# Patient Record
Sex: Male | Born: 1985 | Race: White | Hispanic: No | Marital: Single | Smoking: Current every day smoker
Health system: Southern US, Community
[De-identification: ages and names within clinical notes are randomized; demographics above are authoritative.]

## PROBLEM LIST (undated history)

## (undated) DIAGNOSIS — S069X9A Unspecified intracranial injury with loss of consciousness of unspecified duration, initial encounter: Secondary | ICD-10-CM

## (undated) DIAGNOSIS — S069XAA Unspecified intracranial injury with loss of consciousness status unknown, initial encounter: Secondary | ICD-10-CM

## (undated) DIAGNOSIS — B1920 Unspecified viral hepatitis C without hepatic coma: Secondary | ICD-10-CM

## (undated) HISTORY — PX: TONSILLECTOMY: SUR1361

## (undated) HISTORY — PX: FEMUR FRACTURE SURGERY: SHX633

## (undated) HISTORY — PX: LEG SURGERY: SHX1003

---

## 2004-07-23 ENCOUNTER — Other Ambulatory Visit: Payer: Self-pay

## 2004-07-23 ENCOUNTER — Emergency Department: Payer: Self-pay | Admitting: Internal Medicine

## 2004-12-25 ENCOUNTER — Inpatient Hospital Stay: Payer: Self-pay | Admitting: Psychiatry

## 2006-12-14 ENCOUNTER — Emergency Department: Payer: Self-pay | Admitting: Emergency Medicine

## 2009-02-08 ENCOUNTER — Emergency Department: Payer: Self-pay | Admitting: Emergency Medicine

## 2010-11-05 ENCOUNTER — Inpatient Hospital Stay: Payer: Self-pay | Admitting: Psychiatry

## 2011-11-06 ENCOUNTER — Emergency Department: Payer: Self-pay | Admitting: Emergency Medicine

## 2012-04-07 ENCOUNTER — Emergency Department: Payer: Self-pay | Admitting: Emergency Medicine

## 2015-08-24 ENCOUNTER — Encounter: Payer: Self-pay | Admitting: Emergency Medicine

## 2015-08-24 ENCOUNTER — Emergency Department
Admission: EM | Admit: 2015-08-24 | Discharge: 2015-08-24 | Disposition: A | Discharge status: Home / Self Care | Payer: No Typology Code available for payment source | Attending: Emergency Medicine | Admitting: Emergency Medicine

## 2015-08-24 DIAGNOSIS — F17210 Nicotine dependence, cigarettes, uncomplicated: Secondary | ICD-10-CM | POA: Diagnosis not present

## 2015-08-24 DIAGNOSIS — S3992XA Unspecified injury of lower back, initial encounter: Secondary | ICD-10-CM | POA: Diagnosis not present

## 2015-08-24 DIAGNOSIS — S4992XA Unspecified injury of left shoulder and upper arm, initial encounter: Secondary | ICD-10-CM | POA: Insufficient documentation

## 2015-08-24 DIAGNOSIS — S79911A Unspecified injury of right hip, initial encounter: Secondary | ICD-10-CM | POA: Insufficient documentation

## 2015-08-24 DIAGNOSIS — Y92009 Unspecified place in unspecified non-institutional (private) residence as the place of occurrence of the external cause: Secondary | ICD-10-CM | POA: Diagnosis not present

## 2015-08-24 DIAGNOSIS — Y998 Other external cause status: Secondary | ICD-10-CM | POA: Diagnosis not present

## 2015-08-24 DIAGNOSIS — S60512A Abrasion of left hand, initial encounter: Secondary | ICD-10-CM | POA: Diagnosis not present

## 2015-08-24 DIAGNOSIS — Y9389 Activity, other specified: Secondary | ICD-10-CM | POA: Diagnosis not present

## 2015-08-24 DIAGNOSIS — S6992XA Unspecified injury of left wrist, hand and finger(s), initial encounter: Secondary | ICD-10-CM | POA: Diagnosis present
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
## 2015-08-25 ENCOUNTER — Telehealth: Payer: Self-pay | Admitting: Emergency Medicine

## 2015-08-25 NOTE — ED Notes (Signed)
Called patient due to lwot to inquire about condition and follow up plans. The phone will not accept calls.

## 2015-09-10 ENCOUNTER — Emergency Department
Admission: EM | Admit: 2015-09-10 | Discharge: 2015-09-11 | Disposition: A | Discharge status: Other Acute Inpatient Hospital | Payer: No Typology Code available for payment source | Attending: Emergency Medicine | Admitting: Emergency Medicine

## 2015-09-10 ENCOUNTER — Encounter: Payer: Self-pay | Admitting: Emergency Medicine

## 2015-09-10 DIAGNOSIS — F1010 Alcohol abuse, uncomplicated: Secondary | ICD-10-CM | POA: Insufficient documentation

## 2015-09-10 DIAGNOSIS — F339 Major depressive disorder, recurrent, unspecified: Secondary | ICD-10-CM

## 2015-09-10 DIAGNOSIS — F1910 Other psychoactive substance abuse, uncomplicated: Secondary | ICD-10-CM

## 2015-09-10 DIAGNOSIS — Z79899 Other long term (current) drug therapy: Secondary | ICD-10-CM | POA: Insufficient documentation

## 2015-09-10 DIAGNOSIS — R45851 Suicidal ideations: Secondary | ICD-10-CM | POA: Insufficient documentation

## 2015-09-10 DIAGNOSIS — F1410 Cocaine abuse, uncomplicated: Secondary | ICD-10-CM | POA: Insufficient documentation

## 2015-09-10 DIAGNOSIS — F1320 Sedative, hypnotic or anxiolytic dependence, uncomplicated: Secondary | ICD-10-CM

## 2015-09-10 DIAGNOSIS — F329 Major depressive disorder, single episode, unspecified: Secondary | ICD-10-CM | POA: Insufficient documentation

## 2015-09-10 DIAGNOSIS — F1420 Cocaine dependence, uncomplicated: Secondary | ICD-10-CM

## 2015-09-10 DIAGNOSIS — F17210 Nicotine dependence, cigarettes, uncomplicated: Secondary | ICD-10-CM | POA: Insufficient documentation

## 2015-09-10 DIAGNOSIS — F1520 Other stimulant dependence, uncomplicated: Secondary | ICD-10-CM

## 2015-09-10 HISTORY — DX: Unspecified intracranial injury with loss of consciousness of unspecified duration, initial encounter: S06.9X9A

## 2015-09-10 HISTORY — DX: Unspecified intracranial injury with loss of consciousness status unknown, initial encounter: S06.9XAA

## 2015-09-10 LAB — CBC
HEMATOCRIT: 40.2 % (ref 40.0–52.0)
Hemoglobin: 14.1 g/dL (ref 13.0–18.0)
MCH: 32 pg (ref 26.0–34.0)
MCHC: 35 g/dL (ref 32.0–36.0)
MCV: 91.5 fL (ref 80.0–100.0)
Platelets: 201 10*3/uL (ref 150–440)
RBC: 4.39 MIL/uL — AB (ref 4.40–5.90)
RDW: 13.5 % (ref 11.5–14.5)
WBC: 5.5 10*3/uL (ref 3.8–10.6)

## 2015-09-10 LAB — COMPREHENSIVE METABOLIC PANEL
ALBUMIN: 4.5 g/dL (ref 3.5–5.0)
ALT: 81 U/L — ABNORMAL HIGH (ref 17–63)
ANION GAP: 5 (ref 5–15)
AST: 67 U/L — ABNORMAL HIGH (ref 15–41)
Alkaline Phosphatase: 62 U/L (ref 38–126)
BILIRUBIN TOTAL: 0.6 mg/dL (ref 0.3–1.2)
BUN: 14 mg/dL (ref 6–20)
CHLORIDE: 102 mmol/L (ref 101–111)
CO2: 30 mmol/L (ref 22–32)
Calcium: 8.9 mg/dL (ref 8.9–10.3)
Creatinine, Ser: 1.09 mg/dL (ref 0.61–1.24)
GFR calc non Af Amer: >60 mL/min (ref >60)
Glucose, Bld: 95 mg/dL (ref 65–99)
Potassium: 3.4 mmol/L — ABNORMAL LOW (ref 3.5–5.1)
SODIUM: 137 mmol/L (ref 135–145)
Total Protein: 6.9 g/dL (ref 6.5–8.1)

## 2015-09-10 LAB — URINE DRUG SCREEN, QUALITATIVE (ARMC ONLY)
AMPHETAMINES, UR SCREEN: POSITIVE — AB
Barbiturates, Ur Screen: NOT DETECTED
Benzodiazepine, Ur Scrn: POSITIVE — AB
COCAINE METABOLITE, UR ~~LOC~~: POSITIVE — AB
Cannabinoid 50 Ng, Ur ~~LOC~~: NOT DETECTED
MDMA (ECSTASY) UR SCREEN: NOT DETECTED
Methadone Scn, Ur: NOT DETECTED
OPIATE, UR SCREEN: NOT DETECTED
PHENCYCLIDINE (PCP) UR S: NOT DETECTED
Tricyclic, Ur Screen: NOT DETECTED

## 2015-09-10 LAB — ETHANOL: Alcohol, Ethyl (B): 14 mg/dL — ABNORMAL HIGH (ref <5)

## 2015-09-10 NOTE — ED Notes (Signed)
Pt presents to ED with feelings of depression and wanting to hurt himself for the past several days. Denies plan or recent attempted suicide. Pt states he is not currently hearing voices. Using ETOH, cocaine, and suboxone daily and would like help stopping his substance use problem. Hx of depression.

## 2015-09-10 NOTE — ED Provider Notes (Signed)
[facility] Emergency Department Provider Note  ____________________________________________  Time seen: Approximately 10:21 PM  I have reviewed the triage vital signs and the nursing notes.   HISTORY  Chief Complaint Psychiatric Evaluation and Suicidal    HPI Joseph Hines is a 30 y.o. male history depression. Patient reports that about 2 hours prior to coming in he thought about taking his own life but took no action.  He reports he is depressed, using cocaine, alcohol, and Suboxone.  No fevers chills or recent illness. He can try to seek help through his church, but has not been unable to be placed in a program for substance abuse yet.  Denies any overdose or attempted ingestion or self-harm.  No fever or chills.  Reports a long history of depression and alcohol abuse.  Past Medical History  Diagnosis Date  . Depression   . Traumatic brain injury (HCC)     There are no active problems to display for this patient.   Past Surgical History  Procedure Laterality Date  . Femur fracture surgery      No current outpatient prescriptions on file.  Allergies Review of patient's allergies indicates no known allergies.  No family history on file.  Social History Social History  Substance Use Topics  . Smoking status: Current Every Day Smoker -- 1.00 packs/day    Types: Cigarettes  . Smokeless tobacco: Never Used  . Alcohol Use: 14.4 oz/week    24 Cans of beer per week     Comment: 5 days a week    Review of Systems Constitutional: No fever/chills Eyes: No visual changes. ENT: No sore throat. Cardiovascular: Denies chest pain. Respiratory: Denies shortness of breath. Gastrointestinal: No abdominal pain.  No nausea, no vomiting.  No diarrhea.  No constipation. Genitourinary: Negative for dysuria. Musculoskeletal: Negative for back pain. Skin: Negative for rash. Neurological: Negative for headaches, focal weakness or  numbness.  10-point ROS otherwise negative.  ____________________________________________   PHYSICAL EXAM:  VITAL SIGNS: ED Triage Vitals  Enc Vitals Group     BP 09/10/15 2109 112/74 mmHg     Pulse Rate 09/10/15 2109 101     Resp 09/10/15 2109 20     Temp 09/10/15 2109 98.2 F (36.8 C)     Temp Source 09/10/15 2109 Oral     SpO2 09/10/15 2109 96 %     Weight 09/10/15 2109 190 lb (86.183 kg)     Height 09/10/15 2109 5\' 8"  (1.727 m)     Head Cir --      Peak Flow --      Pain Score --      Pain Loc --      Pain Edu? --      Excl. in GC? --    Constitutional: Alert and oriented. Well appearing and in no acute distress. Eyes: Conjunctivae are normal. PERRL. EOMI. Head: Atraumatic. Nose: No congestion/rhinnorhea. Mouth/Throat: Mucous membranes are moist.  Oropharynx non-erythematous. Neck: No stridor.   Cardiovascular: Normal rate, regular rhythm. Grossly normal heart sounds.  Good peripheral circulation. Respiratory: Normal respiratory effort.  No retractions. Lungs CTAB. Gastrointestinal: Soft and nontender. No distention. Musculoskeletal: No lower extremity tenderness nor edema.  No joint effusions. Neurologic:  Normal speech and language. No gross focal neurologic deficits are appreciated. No gait instability. Skin:  Skin is warm, dry and intact. No rash noted. Psychiatric: Mood and affect are Somewhat depressed, reports suicidal thoughts. ____________________________________________   LABS (all labs ordered are listed, but only abnormal  results are displayed)  Labs Reviewed  COMPREHENSIVE METABOLIC PANEL - Abnormal; Notable for the following:    Potassium 3.4 (*)    AST 67 (*)    ALT 81 (*)    All other components within normal limits  ETHANOL - Abnormal; Notable for the following:    Alcohol, Ethyl (B) 14 (*)    All other components within normal limits  CBC - Abnormal; Notable for the following:    RBC 4.39 (*)    All other components within normal limits   URINE DRUG SCREEN, QUALITATIVE ([facility] ONLY) - Abnormal; Notable for the following:    Amphetamines, Ur Screen POSITIVE (*)    Cocaine Metabolite,Ur [place] POSITIVE (*)    Benzodiazepine, Ur Scrn POSITIVE (*)    All other components within normal limits  ACETAMINOPHEN LEVEL  SALICYLATE LEVEL   ____________________________________________  EKG   ____________________________________________  RADIOLOGY   ____________________________________________   PROCEDURES  Procedure(s) performed: None  Critical Care performed: No  ____________________________________________   INITIAL IMPRESSION / ASSESSMENT AND PLAN / ED COURSE  Pertinent labs & imaging results that were available during my care of the patient were reviewed by me and considered in my medical decision making (see chart for details).  Patient reports suicidal ideation, also has polysubstance abuse including alcohol.As the patient's suicidal thoughts and reporting that 2 hours ago and was thought about and was going to take his own life but did not, I have placed him under involuntary commitment and ordered psychiatric consultation. Because of his history of heavy alcohol abuse I have placed a month C while though at the present time no signs of acute withdrawal.  Labs overall reassuring, ongoing care assigned to Dr. Dolores Frame. She will follow-up on psychiatry recommendations as well as salicylate and Tylenol levels which have been added on. ____________________________________________   FINAL CLINICAL IMPRESSION(S) / ED DIAGNOSES  Final diagnoses:  Recurrent major depressive disorder, remission status unspecified (HCC)  Polysubstance abuse      Sharyn Creamer, MD 09/11/15 [phone]

## 2015-09-10 NOTE — ED Notes (Addendum)
Patient contracts for safety with this RN.  Patient reports increased stress of not being able to find employment and long substance abuse history.  Patient reports suicidal thoughts but no specific plan, "I don't really want to do anything, but I have these thoughts."  Patient denies any outstanding warrants, but reports does have pending court date on May 9th.

## 2015-09-11 ENCOUNTER — Inpatient Hospital Stay
Admission: RE | Admit: 2015-09-11 | Discharge: 2015-09-14 | DRG: 897 | Disposition: A | Discharge status: Home / Self Care | Payer: No Typology Code available for payment source | Source: Intra-hospital | Attending: Psychiatry | Admitting: Psychiatry

## 2015-09-11 DIAGNOSIS — Z9889 Other specified postprocedural states: Secondary | ICD-10-CM

## 2015-09-11 DIAGNOSIS — F1520 Other stimulant dependence, uncomplicated: Secondary | ICD-10-CM | POA: Diagnosis present

## 2015-09-11 DIAGNOSIS — F17210 Nicotine dependence, cigarettes, uncomplicated: Secondary | ICD-10-CM | POA: Diagnosis present

## 2015-09-11 DIAGNOSIS — F1994 Other psychoactive substance use, unspecified with psychoactive substance-induced mood disorder: Secondary | ICD-10-CM

## 2015-09-11 DIAGNOSIS — F17200 Nicotine dependence, unspecified, uncomplicated: Secondary | ICD-10-CM

## 2015-09-11 DIAGNOSIS — F1123 Opioid dependence with withdrawal: Secondary | ICD-10-CM | POA: Diagnosis present

## 2015-09-11 DIAGNOSIS — F19239 Other psychoactive substance dependence with withdrawal, unspecified: Secondary | ICD-10-CM

## 2015-09-11 DIAGNOSIS — F1390 Sedative, hypnotic, or anxiolytic use, unspecified, uncomplicated: Secondary | ICD-10-CM

## 2015-09-11 DIAGNOSIS — G4700 Insomnia, unspecified: Secondary | ICD-10-CM | POA: Diagnosis present

## 2015-09-11 DIAGNOSIS — F1420 Cocaine dependence, uncomplicated: Secondary | ICD-10-CM

## 2015-09-11 DIAGNOSIS — F1120 Opioid dependence, uncomplicated: Secondary | ICD-10-CM

## 2015-09-11 DIAGNOSIS — F1020 Alcohol dependence, uncomplicated: Secondary | ICD-10-CM

## 2015-09-11 DIAGNOSIS — F1320 Sedative, hypnotic or anxiolytic dependence, uncomplicated: Secondary | ICD-10-CM

## 2015-09-11 DIAGNOSIS — F3289 Other specified depressive episodes: Secondary | ICD-10-CM

## 2015-09-11 DIAGNOSIS — F1924 Other psychoactive substance dependence with psychoactive substance-induced mood disorder: Secondary | ICD-10-CM | POA: Diagnosis present

## 2015-09-11 DIAGNOSIS — F329 Major depressive disorder, single episode, unspecified: Secondary | ICD-10-CM | POA: Diagnosis present

## 2015-09-11 LAB — ACETAMINOPHEN LEVEL

## 2015-09-11 LAB — SALICYLATE LEVEL

## 2015-09-11 MED ORDER — IBUPROFEN 800 MG PO TABS
ORAL_TABLET | ORAL | Status: AC
  Filled 2015-09-11: qty 1

## 2015-09-11 MED ORDER — IBUPROFEN 800 MG PO TABS
800.0000 mg | ORAL_TABLET | Freq: Once | ORAL | Status: AC
  Administered 2015-09-11: 800 mg via ORAL

## 2015-09-11 MED ORDER — CITALOPRAM HYDROBROMIDE 20 MG PO TABS
20.0000 mg | ORAL_TABLET | Freq: Every day | ORAL | Status: DC
Start: 2015-09-11 — End: 2015-09-11
  Administered 2015-09-11: 20 mg via ORAL
  Filled 2015-09-11: qty 1

## 2015-09-11 MED ORDER — HALOPERIDOL 0.5 MG PO TABS
1.0000 mg | ORAL_TABLET | Freq: Two times a day (BID) | ORAL | Status: DC
  Administered 2015-09-11 (×2): 1 mg via ORAL
  Filled 2015-09-11 (×2): qty 2

## 2015-09-11 NOTE — ED Notes (Signed)
Patient asleep in room. No noted distress or abnormal behavior. Will continue 15 minute checks and observation by security cameras for safety. 

## 2015-09-11 NOTE — ED Notes (Signed)
Patient asleep in room. No noted distress or abnormal behavior. Will continue 15 minute checks and observation by security cameras for safety. 
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
## 2015-09-11 NOTE — Consult Note (Signed)
[facility] Face-to-Face Psychiatry Consult   Reason for Consult:  SI with substance abuse  Referring Physician:  Delman Kitten, MD  Patient Identification: Joseph Hines MRN:  [id number]   Principal Diagnosis: Substance or medication-induced depressive disorder with onset during withdrawal [facility]) Diagnosis:   Patient Active Problem List   Diagnosis Date Noted  . Substance or medication-induced depressive disorder with onset during withdrawal ([place]) [F19.94] 09/11/2015  . Alcohol use disorder, severe, in controlled environment ([place]) [F10.20] 09/11/2015  . Cocaine use disorder, severe, dependence ([place]) [F14.20] 09/11/2015  . Severe benzodiazepine use disorder [F13.90] 09/11/2015  . Amphetamine use disorder, severe, dependence ([place]) [F15.20] 09/11/2015    Total Time spent with patient: 30 minutes  Subjective:   Joseph Hines is a 30 y.o. male patient with a h/o depression and substance abuse who presented to [facility] ED on 09-11-15 due to 2 hours of SI prior to ED arrival. The pt denied to the ED provider that he tried to take his life.  He reported to ED nursing that he is under increased stress due to not being able to find employment. He shared with ED nursing, "I don't really want to do anything, but I have these thoughts."  The pt spoke with TTS early on the morning of 09-11-15 and notes he is depressed and wants detox. He uses heroin (IV/snorting), cocaine (smoking/snorting), alcohol and Suboxone (he places the strips under his tongue).  Pt reported a past overdose on heroin. He also reported past detox treatment which was helpful while enrolled in a substance abuse treatment program.  Shortly after discharge from substance abuse treatment, the pt relapsed.    Current triggers of depression include no supports, unemployment, past/current legal charges & conflict with current relationship with his girlfriend. Pt's girlfriend was unfaithful and now the pt has nowhere to live as he was living with his  girlfriend prior to admission.  Due to releases from prison in 2007 and releases from county jail last year, he finds it difficult to adjust to community living.  He has an upcoming court date on 10-04-2015 for hit and run/property damage.    Today on interview, the pt is resting comfortably in bed. He is easily awakened. Pt reviewed his reasons for admission sharing, "I've had enough of feeling the way I've been feeling." He discussed being depressed for years but "last night was enough."    His longest sobriety was when he was in prison for 1 year. He shared the aforementioned stressors which are worsening his depression. He feels his depression improves when he self-medicates with drugs.   He endorses feeling paranoid and judged by others. "I've made some mistakes in my life and I'm always looking around me." He endorses feelings of hopelessness, helplessness and worthlessness. He endorses tearfulness. He denies having supports and notes his parents don't' want to speak with him. He is not currently on medications and does not believe he has been on psychotropic medications in the past unless when he was in prison. Pt denies SI and HI. He feels at times he will experience AH and VH but he is not certain. He endorses feeling irritable, agitated. He also endorses withdrawals symptoms including feeling clammy and cold.   He notes he drinks one case of beer daily. He denies SI, HI and AVH. Pt is asking for help to treat depression as well as substance abuse.   Past Psychiatric History:  Dx: none Denies having a psychiatrist or therapist.  Denies past psychiatric hospitalizations.  Denies  previous psychotropic medication treatment unless he took a medication when he was in prison.   Risk to Self: Suicidal Ideation: Yes-Currently Present Suicidal Intent: Yes-Currently Present Is patient at risk for suicide?: Yes Suicidal Plan?: No-Not Currently/Within Last 6 Months ("Haven't thought that  far") Access to Means: No What has been your use of drugs/alcohol within the last 12 months?: Heroin, cocaine, alcohol, suboxene How many times?: 0 Other Self Harm Risks: None Reported Triggers for Past Attempts: None known Intentional Self Injurious Behavior: None Risk to Others: Homicidal Ideation: No Thoughts of Harm to Others: No Current Homicidal Intent: No Current Homicidal Plan: No Access to Homicidal Means: No Identified Victim: N/A History of harm to others?: Yes (Past legal involvement) Assessment of Violence: None Noted Violent Behavior Description: N/A Does patient have access to weapons?: No Criminal Charges Pending?: Yes Describe Pending Criminal Charges: Hit & Run/Property Damage Does patient have a court date: Yes Court Date: 10/01/15 Prior Inpatient Therapy: Prior Inpatient Therapy: Yes Prior Therapy Dates: [facility] Prior Therapy Facilty/Provider(s): [facility] Reason for Treatment: Detox Prior Outpatient Therapy: Prior Outpatient Therapy: Yes Prior Therapy Dates: Adolescent Age Prior Therapy Facilty/Provider(s): [facility] Reason for Treatment: "My parents tried to understand" Does patient have an ACCT team?: No Does patient have Intensive In-House Services?  : No Does patient have [facility] services? : No Does patient have P4CC services?: No  Past Medical History:  Past Medical History  Diagnosis Date  . Depression   . Traumatic brain injury [facility])     Past Surgical History  Procedure Laterality Date  . Femur fracture surgery     Family History: No family history on file.   Family Psychiatric History: Unknown  Social History:  History  Alcohol Use  . 14.4 oz/week  . 24 Cans of beer per week    Comment: 5 days a week     History  Drug Use  . Yes  . Special: Cocaine    Comment: suboxene, adderall, klonopin    Social History   Social History  . Marital Status: Single    Spouse Name: N/A  . Number of Children: N/A  . Years of Education: N/A   Social  History Main Topics  . Smoking status: Current Every Day Smoker -- 1.00 packs/day    Types: Cigarettes  . Smokeless tobacco: Never Used  . Alcohol Use: 14.4 oz/week    24 Cans of beer per week     Comment: 5 days a week  . Drug Use: Yes    Special: Cocaine     Comment: suboxene, adderall, klonopin  . Sexual Activity: Yes   Other Topics Concern  . None   Social History Narrative   Additional Social History:    Allergies:  No Known Allergies  Labs:  Results for orders placed or performed during the hospital encounter of 09/10/15 (from the past 48 hour(s))  Comprehensive metabolic panel     Status: Abnormal   Collection Time: 09/10/15  9:27 PM  Result Value Ref Range   Sodium 137 135 - 145 mmol/L   Potassium 3.4 (L) 3.5 - 5.1 mmol/L   Chloride 102 101 - 111 mmol/L   CO2 30 22 - 32 mmol/L   Glucose, Bld 95 65 - 99 mg/dL   BUN 14 6 - 20 mg/dL   Creatinine, Ser 1.09 0.61 - 1.24 mg/dL   Calcium 8.9 8.9 - 10.3 mg/dL   Total Protein 6.9 6.5 - 8.1 g/dL   Albumin 4.5 3.5 - 5.0 g/dL  AST 67 (H) 15 - 41 U/L   ALT 81 (H) 17 - 63 U/L   Alkaline Phosphatase 62 38 - 126 U/L   Total Bilirubin 0.6 0.3 - 1.2 mg/dL   GFR calc non Af Amer >60 >60 mL/min   GFR calc Af Amer >60 >60 mL/min    Comment: (NOTE) The eGFR has been calculated using the CKD EPI equation. This calculation has not been validated in all clinical situations. eGFR's persistently <60 mL/min signify possible Chronic Kidney Disease.    Anion gap 5 5 - 15  Ethanol (ETOH)     Status: Abnormal   Collection Time: 09/10/15  9:27 PM  Result Value Ref Range   Alcohol, Ethyl (B) 14 (H) <5 mg/dL    Comment:        LOWEST DETECTABLE LIMIT FOR SERUM ALCOHOL IS 5 mg/dL FOR MEDICAL PURPOSES ONLY   CBC     Status: Abnormal   Collection Time: 09/10/15  9:27 PM  Result Value Ref Range   WBC 5.5 3.8 - 10.6 K/uL   RBC 4.39 (L) 4.40 - 5.90 MIL/uL   Hemoglobin 14.1 13.0 - 18.0 g/dL   HCT 40.2 40.0 - 52.0 %   MCV 91.5 80.0 -  100.0 fL   MCH 32.0 26.0 - 34.0 pg   MCHC 35.0 32.0 - 36.0 g/dL   RDW 13.5 11.5 - 14.5 %   Platelets 201 150 - 440 K/uL  Acetaminophen level     Status: Abnormal   Collection Time: 09/10/15  9:27 PM  Result Value Ref Range   Acetaminophen (Tylenol), Serum <10 (L) 10 - 30 ug/mL    Comment:        THERAPEUTIC CONCENTRATIONS VARY SIGNIFICANTLY. A RANGE OF 10-30 ug/mL MAY BE AN EFFECTIVE CONCENTRATION FOR MANY PATIENTS. HOWEVER, SOME ARE BEST TREATED AT CONCENTRATIONS OUTSIDE THIS RANGE. ACETAMINOPHEN CONCENTRATIONS >150 ug/mL AT 4 HOURS AFTER INGESTION AND >50 ug/mL AT 12 HOURS AFTER INGESTION ARE OFTEN ASSOCIATED WITH TOXIC REACTIONS.   Salicylate level     Status: None   Collection Time: 09/10/15  9:27 PM  Result Value Ref Range   Salicylate Lvl <6.7 2.8 - 30.0 mg/dL  Urine Drug Screen, Qualitative ([facility] only)     Status: Abnormal   Collection Time: 09/10/15  9:42 PM  Result Value Ref Range   Tricyclic, Ur Screen NONE DETECTED NONE DETECTED   Amphetamines, Ur Screen POSITIVE (A) NONE DETECTED   MDMA (Ecstasy)Ur Screen NONE DETECTED NONE DETECTED   Cocaine Metabolite,Ur [place] POSITIVE (A) NONE DETECTED   Opiate, Ur Screen NONE DETECTED NONE DETECTED   Phencyclidine (PCP) Ur S NONE DETECTED NONE DETECTED   Cannabinoid 50 Ng, Ur [place] NONE DETECTED NONE DETECTED   Barbiturates, Ur Screen NONE DETECTED NONE DETECTED   Benzodiazepine, Ur Scrn POSITIVE (A) NONE DETECTED   Methadone Scn, Ur NONE DETECTED NONE DETECTED    Comment: (NOTE) 341  Tricyclics, urine               Cutoff 1000 ng/mL 200  Amphetamines, urine             Cutoff 1000 ng/mL 300  MDMA (Ecstasy), urine           Cutoff 500 ng/mL 400  Cocaine Metabolite, urine       Cutoff 300 ng/mL 500  Opiate, urine                   Cutoff 300 ng/mL 600  Phencyclidine (PCP), urine  Cutoff 25 ng/mL 700  Cannabinoid, urine              Cutoff 50 ng/mL 800  Barbiturates, urine             Cutoff 200 ng/mL 900  Benzodiazepine,  urine           Cutoff 200 ng/mL 1000 Methadone, urine                Cutoff 300 ng/mL 1100 1200 The urine drug screen provides only a preliminary, unconfirmed 1300 analytical test result and should not be used for non-medical 1400 purposes. Clinical consideration and professional judgment should 1500 be applied to any positive drug screen result due to possible 1600 interfering substances. A more specific alternate chemical method 1700 must be used in order to obtain a confirmed analytical result.  1800 Gas chromato graphy / mass spectrometry (GC/MS) is the preferred 1900 confirmatory method.     No current facility-administered medications for this encounter.   No current outpatient prescriptions on file.    Musculoskeletal: Strength & Muscle Tone: within normal limits Gait & Station: normal Patient leans: N/A  Psychiatric Specialty Exam: Review of Systems  Constitutional: Negative.   HENT: Negative.   Eyes: Negative.   Respiratory: Negative.   Cardiovascular: Negative.   Gastrointestinal: Negative.   Genitourinary: Negative.   Musculoskeletal: Negative.   Skin: Negative.   Neurological: Negative.   Endo/Heme/Allergies: Negative.   Psychiatric/Behavioral: Positive for depression, hallucinations and substance abuse. Negative for suicidal ideas and memory loss. The patient is nervous/anxious and has insomnia.        Feels cold and clammy due to withdrawals.     Blood pressure 105/66, pulse 77, temperature 98 F (36.7 C), temperature source Oral, resp. rate 16, height 5' 8"  (1.727 m), weight 86.183 kg (190 lb), SpO2 95 %.Body mass index is 28.9 kg/(m^2).  General Appearance: Casual, Fairly Groomed and tattooed  Eye Contact::  Poor  Speech:  Clear and Coherent and Normal Rate  Volume:  Normal  Mood:  Anxious, Dysphoric, Hopeless, Irritable and Worthless  Affect:  Restricted  Thought Process:  Coherent, Goal Directed, Intact, Linear and Logical  Orientation:  Full  (Time, Place, and Person)  Thought Content:  Hallucinations: Auditory Command:  No Visual and Paranoid Ideation  Suicidal Thoughts:  No  Homicidal Thoughts:  No  Memory:  Immediate;   Good Recent;   Good Remote;   Good  Judgement:  Poor  Insight:  Fair  Psychomotor Activity:  Normal  Concentration:  Fair  Recall:  Good  Fund of Knowledge:Good  Language: Good  Akathisia:  Negative  Handed:  Right  AIMS (if indicated):     Assets:  Communication Skills Desire for Improvement Physical Health  ADL's:  Intact  Cognition: WNL  Sleep:      Treatment Plan Summary: Daily contact with patient to assess and evaluate symptoms and progress in treatment and Medication management   - Admit to inpatient psychiatry.   1. Substance induced depressive disorder: worsening  - Start citalopram 36m po QD.  - Start haloperidol 128mpo BID for paranoia and AVH.   2. Alcohol use disorder, severe:  - Continue CIWA protocol  3. Cocaine use disorder, severe:  - Symptomatic treatment as necessary  4. Benzodiazepine use disorder, severe  - Pt currently on CIWA  - - Symptomatic treatment as necessary  5. Amphetamine use disorder, severe:  - Symptomatic treatment as necessary  6. R/B/Se discussed including but not  limited to any and all black box warnings, akathisia, GI upset, mood derangements, EPS and TD.   7. Continue to monitor.   Disposition: Recommend psychiatric Inpatient admission when medically cleared.Opioid   Donita Brooks, MD 09/11/2015 9:54 AM

## 2015-09-11 NOTE — ED Notes (Signed)
Pt given a snack and drink, resting in room. Medication administered as ordered by ER MD. Maintained on 15 minute checks and observation by security camera for safety.

## 2015-09-11 NOTE — ED Provider Notes (Signed)
-----------------------------------------   11:37 PM on 09/11/2015 -----------------------------------------  Patient accepted to the behavioral health unit. Resting comfortably in no acute distress.  Irean HongJade J Rithik Odea, MD 09/11/15 [id number]

## 2015-09-11 NOTE — ED Notes (Signed)
Talking to TTS 

## 2015-09-11 NOTE — ED Notes (Signed)
Pt is alert on admission but somewhat disoriented to place. Writer oriented pt to the BHU and provided fluids. Pt endorses passive SI but does contract for safety. Pt mood is sad/depressed and he is somewhat lethargic. Pt exhibits some thought blocking as well, but is pleasant and cooperative with staff. Writer explained plan of care and 15 minute checks are ongoing for safety. Pt went to sleep right away.

## 2015-09-11 NOTE — ED Notes (Signed)
Pt cooperative with staff. Pt stated he has "pain all over" because "Ive been taking suboxone everyday for the past 3 years." RN will notify ER MD. Pt denies SI/HI and AVH. Pt did not forward much information to this Clinical research associatewriter. He did state there is a lot going on in his life and he has been feeling depressed. No distress noted. Maintained on 15 minute checks and observation by security camera for safety.

## 2015-09-11 NOTE — BH Assessment (Signed)
[facility] Dr. Jenne CampusMcQueen.  Attending Physician will be Dr. Ardyth HarpsHernandez.   Patient [facility] Cliff P.   Intake Paper Work has been signed and placed on patient chart.  ER staff is aware of the admission ( Amy B. Patient's Nurse & Nedra HaiLee, Patient Access).

## 2015-09-11 NOTE — ED Notes (Signed)
Pt asking when he will be transferred to BMU. RN informed pt it will be  Pt accepting. Pt continuing to rest in his room. Maintained on 15 minute checks and observation by security camera for safety.

## 2015-09-11 NOTE — ED Notes (Signed)

## 2015-09-11 NOTE — ED Provider Notes (Signed)
-----------------------------------------   6:48 AM on 09/11/2015 -----------------------------------------   Blood pressure 105/66, pulse 77, temperature 98 F (36.7 C), temperature source Oral, resp. rate 16, height 5\' 8"  (1.727 m), weight 190 lb (86.183 kg), SpO2 95 %.  The patient had no acute events since last update.  Calm and cooperative at this time.  Disposition is pending per Psychiatry/Behavioral Medicine team recommendations.     Irean HongJade J Bailie Christenbury, MD 09/11/15 [phone]

## 2015-09-12 DIAGNOSIS — F17200 Nicotine dependence, unspecified, uncomplicated: Secondary | ICD-10-CM

## 2015-09-12 DIAGNOSIS — F1120 Opioid dependence, uncomplicated: Secondary | ICD-10-CM

## 2015-09-12 DIAGNOSIS — F3289 Other specified depressive episodes: Secondary | ICD-10-CM

## 2015-09-12 DIAGNOSIS — F1020 Alcohol dependence, uncomplicated: Secondary | ICD-10-CM

## 2015-09-12 DIAGNOSIS — F1924 Other psychoactive substance dependence with psychoactive substance-induced mood disorder: Secondary | ICD-10-CM

## 2015-09-12 DIAGNOSIS — F1994 Other psychoactive substance use, unspecified with psychoactive substance-induced mood disorder: Secondary | ICD-10-CM

## 2015-09-12 DIAGNOSIS — F19239 Other psychoactive substance dependence with withdrawal, unspecified: Secondary | ICD-10-CM

## 2015-09-12 MED ORDER — MAGNESIUM HYDROXIDE 400 MG/5ML PO SUSP: 30.0000 mL | Freq: Every day | ORAL | Status: DC | PRN

## 2015-09-12 MED ORDER — NICOTINE 21 MG/24HR TD PT24
21.0000 mg | MEDICATED_PATCH | Freq: Every day | TRANSDERMAL | Status: DC
  Administered 2015-09-12 – 2015-09-14 (×4): 21 mg via TRANSDERMAL
  Filled 2015-09-12 (×4): qty 1

## 2015-09-12 MED ORDER — NICOTINE 21 MG/24HR TD PT24: 21.0000 mg | MEDICATED_PATCH | Freq: Every day | TRANSDERMAL | Status: DC

## 2015-09-12 MED ORDER — ONDANSETRON 4 MG PO TBDP
4.0000 mg | ORAL_TABLET | Freq: Four times a day (QID) | ORAL | Status: DC | PRN
  Filled 2015-09-12: qty 1

## 2015-09-12 MED ORDER — CHLORDIAZEPOXIDE HCL 25 MG PO CAPS: 25.0000 mg | ORAL_CAPSULE | Freq: Four times a day (QID) | ORAL | Status: DC | PRN

## 2015-09-12 MED ORDER — HALOPERIDOL 0.5 MG PO TABS
1.0000 mg | ORAL_TABLET | Freq: Two times a day (BID) | ORAL | Status: DC
  Administered 2015-09-12: 1 mg via ORAL
  Filled 2015-09-12 (×2): qty 2

## 2015-09-12 MED ORDER — HALOPERIDOL 5 MG PO TABS
5.0000 mg | ORAL_TABLET | Freq: Four times a day (QID) | ORAL | Status: DC | PRN
Start: 2015-09-12 — End: 2015-09-12

## 2015-09-12 MED ORDER — CITALOPRAM HYDROBROMIDE 20 MG PO TABS
20.0000 mg | ORAL_TABLET | Freq: Every day | ORAL | Status: DC
  Administered 2015-09-12: 20 mg via ORAL
  Filled 2015-09-12: qty 1

## 2015-09-12 MED ORDER — ACETAMINOPHEN 325 MG PO TABS
650.0000 mg | ORAL_TABLET | Freq: Four times a day (QID) | ORAL | Status: DC | PRN
  Administered 2015-09-12 – 2015-09-14 (×4): 650 mg via ORAL
  Filled 2015-09-12 (×5): qty 2

## 2015-09-12 MED ORDER — HYDROXYZINE HCL 25 MG PO TABS: 25.0000 mg | ORAL_TABLET | Freq: Four times a day (QID) | ORAL | Status: DC | PRN

## 2015-09-12 MED ORDER — ADULT MULTIVITAMIN W/MINERALS CH: 1.0000 | ORAL_TABLET | Freq: Every day | ORAL | Status: DC

## 2015-09-12 MED ORDER — ALUM & MAG HYDROXIDE-SIMETH 200-200-20 MG/5ML PO SUSP: 30.0000 mL | ORAL | Status: DC | PRN

## 2015-09-12 MED ORDER — HYDROXYZINE HCL 25 MG PO TABS
25.0000 mg | ORAL_TABLET | Freq: Four times a day (QID) | ORAL | Status: DC | PRN
  Administered 2015-09-14: 25 mg via ORAL
  Filled 2015-09-12: qty 1

## 2015-09-12 MED ORDER — VITAMIN B-1 100 MG PO TABS: 100.0000 mg | ORAL_TABLET | Freq: Every day | ORAL | Status: DC

## 2015-09-12 MED ORDER — TRAZODONE HCL 100 MG PO TABS: 100.0000 mg | ORAL_TABLET | Freq: Every evening | ORAL | Status: DC | PRN

## 2015-09-12 MED ORDER — BENZTROPINE MESYLATE 1 MG PO TABS: 1.0000 mg | ORAL_TABLET | Freq: Four times a day (QID) | ORAL | Status: DC | PRN

## 2015-09-12 MED ORDER — TRAZODONE HCL 100 MG PO TABS
100.0000 mg | ORAL_TABLET | Freq: Every day | ORAL | Status: DC
  Administered 2015-09-12: 100 mg via ORAL
  Filled 2015-09-12: qty 1

## 2015-09-12 MED ORDER — LOPERAMIDE HCL 2 MG PO CAPS
2.0000 mg | ORAL_CAPSULE | ORAL | Status: DC | PRN
  Administered 2015-09-13: 4 mg via ORAL
  Filled 2015-09-12: qty 2

## 2015-09-12 NOTE — Progress Notes (Signed)
Recreation Therapy Notes  INPATIENT RECREATION THERAPY ASSESSMENT  Patient Details Name: Joseph QuintonJustin Vanliew MRN: [id number] DOB: 07/19/1985 Today's Date: 09/12/2015  Patient Stressors: Family, Relationship, Friends, Work, Other (Comment) (Family not answering the phone when he calls; recent break-up; lack of suppotive friends; recent job loss due to not showing up; everything; life in general)  Coping Skills:   Isolate, Arguments, Substance Abuse, Avoidance, Exercise, Art/Dance, Talking, Music, Sports, Other (Comment) (Work on things, stay busy, keep mind off things)  Personal Challenges: Anger, Communication, Concentration, Decision-Making, Expressing Yourself, Relationships, Self-Esteem/Confidence, Social Interaction, Stress Management, Substance Abuse, Time Management, Trusting Others  Leisure Interests (2+):  Individual - Other (Comment) (Play sports, work on arts and crafts)  Biochemist, clinicalAwareness of Community Resources:  Yes  Community Resources:  [employer], Other (Comment) Therapist, music(Recreation Center)  Current Use: Yes  If no, Barriers?:    Patient Strengths:  Tries to do best at what he can, being honest  Patient Identified Areas of Improvement:  Every area  Current Recreation Participation:  Go to the movies  Patient Goal for Hospitalization:  To get clean and sober and stay that way  [place]  [place] Residence:  [place]   Current SI (including self-harm):  No  Current HI:  No  Consent to Intern Participation: N/A   Jacquelynn CreeGreene,Krishav Mamone M, LRT/CTRS 09/12/2015, 11:58 AM

## 2015-09-12 NOTE — BHH Suicide Risk Assessment (Signed)
[facility]   Nursing information obtained from:  Patient Demographic factors:  Male, Caucasian, Low socioeconomic status, Unemployed Current Mental Status:  Suicidal ideation indicated by patient, Self-harm thoughts Loss Factors:  Legal issues, Financial problems / change in socioeconomic status (court date on 5/9 ) Historical Factors:  Family history of mental illness or substance abuse (father-alcohol ) Risk Reduction Factors:  NA  Total Time spent with patient: 1 hour Principal Problem: Substance or medication-induced depressive disorder with onset during withdrawal [facility](HCC) Diagnosis:   Patient Active Problem List   Diagnosis Date Noted  . Alcohol use disorder, severe, dependence (HCC) [F10.20] 09/12/2015  . Opioid use disorder, moderate, dependence (HCC) [F11.20] 09/12/2015  . Tobacco use disorder [F17.200] 09/12/2015  . 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 Cocaine use disorder, severe, dependence (HCC) [F14.20] 09/11/2015  . Severe benzodiazepine use disorder [F13.90] 09/11/2015  . Amphetamine use disorder, severe, dependence (HCC) [F15.20] 09/11/2015   Subjective Data:   Continued Clinical Symptoms:  Alcohol Use Disorder Identification Test Final Score (AUDIT): 39 The "Alcohol Use Disorders Identification Test", Guidelines for Use in Primary Care, Second Edition.  World Science writerHealth Organization [facility](WHO). Score between 0-7:  no or low risk or alcohol related problems. Score between 8-15:  moderate risk of alcohol related problems. Score between 16-19:  high risk of alcohol related problems. Score 20 or above:  warrants further diagnostic evaluation for alcohol dependence and treatment.   CLINICAL FACTORS:   Severe Anxiety and/or Agitation Depression:   Comorbid alcohol abuse/dependence Impulsivity Alcohol/Substance Abuse/Dependencies     Psychiatric Specialty Exam: ROS  Blood  pressure 112/68, pulse 66, temperature 98.4 F (36.9 C), temperature source Oral, resp. rate 18, height 5\' 8"  (1.727 m), weight 83.008 kg (183 lb), SpO2 95 %.Body mass index is 27.83 kg/(m^2).                                                        COGNITIVE FEATURES THAT CONTRIBUTE TO RISK:  None    SUICIDE RISK:   Mild:  Suicidal ideation of limited frequency, intensity, duration, and specificity.  There are no identifiable plans, no associated intent, mild dysphoria and related symptoms, good self-control (both objective and subjective assessment), few other risk factors, and identifiable protective factors, including available and accessible social support.  [facility]  I certify that inpatient services furnished can reasonably be expected to improve the patient's condition.   Jimmy FootmanHernandez-Gonzalez,  Cardale Dorer, MD 09/12/2015, 4:26 PM

## 2015-09-12 NOTE — Progress Notes (Signed)
  NUTRITION ASSESSMENT  Pt identified as at risk on the Malnutrition Screen Tool  INTERVENTION: 1. Encourage importance of good nutrition; encourage intake of food and beverages. 2. Encourage menu completion to best meet pt preferences  NUTRITION DIAGNOSIS: Unintentional weight loss related to sub-optimal intake as evidenced by pt report.   Goal: Pt to meet >/= 90% of their estimated nutrition needs.  Monitor:  PO intake  Assessment:   30 y.o. male substance or medication-induced depressive disorder with onset during withdrawal. Pt reports drinking one case of beer daily; +cocaine, benzodiazepines, amphetamines on admision  Past Medical History  Diagnosis Date  . Depression   . Traumatic brain injury (HCC)     Height: Ht Readings from Last 1 Encounters:  09/12/15 5\' 8"  (1.727 m)    Weight: Wt Readings from Last 1 Encounters:  09/12/15 183 lb (83.008 kg)    Weight Hx: Wt Readings from Last 10 Encounters:  09/12/15 183 lb (83.008 kg)  09/10/15 190 lb (86.183 kg)    BMI:  Body mass index is 27.83 kg/(m^2).  Estimated Nutritional Needs: Kcal: 25-30 kcal/kg Protein: > 1 gram protein/kg Fluid: 1 ml/kcal  Diet Order: Diet regular Room service appropriate?: Yes; Fluid consistency:: Thin Pt is also offered choice of scheduled unit snacks. Pt is eating as desired.   Lab results and medications reviewed.   Romelle Starcherate Lexus Barletta MS, RD, LDN [phone] Pager  [phone] Weekend/On-Call Pager
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
## 2015-09-12 NOTE — H&P (Signed)
Psychiatric Admission Assessment Adult  Patient Identification: Joseph Hines MRN:  [id number] Date of Evaluation:  09/12/2015 Chief Complaint:  Depression Principal Diagnosis: Substance or medication-induced depressive disorder with onset during withdrawal [facility]) Diagnosis:   Patient Active Problem List   Diagnosis Date Noted  . Alcohol use disorder, severe, dependence ([place]) [F10.20] 09/12/2015  . Opioid use disorder, moderate, dependence ([place]) [F11.20] 09/12/2015  . Tobacco use disorder [F17.200] 09/12/2015  . Substance or medication-induced depressive disorder with onset during withdrawal ([place]) [F19.94] 09/12/2015  . Cocaine use disorder, severe, dependence ([place]) [F14.20] 09/11/2015  . Severe benzodiazepine use disorder [F13.90] 09/11/2015  . Amphetamine use disorder, severe, dependence ([place]) [F15.20] 09/11/2015   History of Present Illness:  Joseph Hines is a 30 y.o. male patient with a h/o depression and substance abuse who presented to [facility] ED on 09-11-15 due to 2 hours of SI prior to ED arrival. The pt denied to the ED provider that he tried to take his life. He reported to ED nursing that he is under increased stress due to not being able to find employment. He shared with ED nursing, "I don't really want to do anything, but I have these thoughts."   He uses heroin (IV/snorting), cocaine (smoking/snorting), alcohol and Suboxone (he places the strips under his tongue). Pt reported a past overdose on heroin. He also reported past detox treatment which was helpful while enrolled in a substance abuse treatment program. Shortly after discharge from substance abuse treatment, the pt relapsed.   Current triggers of depression include no supports, unemployment, past/current legal charges & conflict with current relationship with his girlfriend. Pt's girlfriend was unfaithful and now the pt has nowhere to live as he was living with his girlfriend prior to admission. Due to releases from  prison in 2007 and releases from county jail last year, he finds it difficult to adjust to community living. He has an upcoming court date on 10-04-2015 for hit and run/property damage.   His longest sobriety was when he was in prison for 1 year.   He notes he drinks one case of beer daily. He denies SI, HI and AVH. Pt is asking for help to treat depression as well as substance abuse.   Today the patient tells me that he is stopped drinking alcohol about 2 weeks ago and therefore he is not withdrawing from at. However he said he was drinking up to a case of beer per day. The patient tells me that the major issue right now is opiates he has been using Suboxone which he purchases on the street. The patient is currently divorced and symptoms of opiate withdrawal. Patient is very interested in residential treatment for substance abuse. He says that he has failed outpatient treatment in the past before.  Associated Signs/Symptoms: Depression Symptoms:  depressed mood, recurrent thoughts of death, (Hypo) Manic Symptoms:  Impulsivity, Anxiety Symptoms:  denies Psychotic Symptoms:  denies PTSD Symptoms: NA Total Time spent with patient: 1 hour  Past Psychiatric History: Patient reports being hospitalized here in 2009.   Records there is a visiting 2006 and another one in 2012. I'm not able to access to records. There is only the admission diagnoses which is opiate detox.  No history of suicidal attempts   Is the patient at risk to self? Yes.    Has the patient been a risk to self in the past 6 months? No.  Has the patient been a risk to self within the distant past? Yes.    Is  the patient a risk to others? No.  Has the patient been a risk to others in the past 6 months? Yes.    Has the patient been a risk to others within the distant past? Yes.      Past Medical History:  Past Medical History  Diagnosis Date  . Depression   . Traumatic brain injury [facility])     Past Surgical History   Procedure Laterality Date  . Femur fracture surgery     Family History: History reviewed. No pertinent family history.  Family Psychiatric  History:No known history of mental illness   Social History:  History  Alcohol Use  . 14.4 oz/week  . 24 Cans of beer per week    Comment: 5 days a week     History  Drug Use  . Yes  . Special: Cocaine    Comment: suboxene, adderall, klonopin    Additional Social History: Marital status: Single Does patient have children?: No  Allergies:  No Known Allergies   Lab Results:  Results for orders placed or performed during the hospital encounter of 09/10/15 (from the past 48 hour(s))  Comprehensive metabolic panel     Status: Abnormal   Collection Time: 09/10/15  9:27 PM  Result Value Ref Range   Sodium 137 135 - 145 mmol/L   Potassium 3.4 (L) 3.5 - 5.1 mmol/L   Chloride 102 101 - 111 mmol/L   CO2 30 22 - 32 mmol/L   Glucose, Bld 95 65 - 99 mg/dL   BUN 14 6 - 20 mg/dL   Creatinine, Ser 1.09 0.61 - 1.24 mg/dL   Calcium 8.9 8.9 - 10.3 mg/dL   Total Protein 6.9 6.5 - 8.1 g/dL   Albumin 4.5 3.5 - 5.0 g/dL   AST 67 (H) 15 - 41 U/L   ALT 81 (H) 17 - 63 U/L   Alkaline Phosphatase 62 38 - 126 U/L   Total Bilirubin 0.6 0.3 - 1.2 mg/dL   GFR calc non Af Amer >60 >60 mL/min   GFR calc Af Amer >60 >60 mL/min    Comment: (NOTE) The eGFR has been calculated using the CKD EPI equation. This calculation has not been validated in all clinical situations. eGFR's persistently <60 mL/min signify possible Chronic Kidney Disease.    Anion gap 5 5 - 15  Ethanol (ETOH)     Status: Abnormal   Collection Time: 09/10/15  9:27 PM  Result Value Ref Range   Alcohol, Ethyl (B) 14 (H) <5 mg/dL    Comment:        LOWEST DETECTABLE LIMIT FOR SERUM ALCOHOL IS 5 mg/dL FOR MEDICAL PURPOSES ONLY   CBC     Status: Abnormal   Collection Time: 09/10/15  9:27 PM  Result Value Ref Range   WBC 5.5 3.8 - 10.6 K/uL   RBC 4.39 (L) 4.40 - 5.90 MIL/uL    Hemoglobin 14.1 13.0 - 18.0 g/dL   HCT 40.2 40.0 - 52.0 %   MCV 91.5 80.0 - 100.0 fL   MCH 32.0 26.0 - 34.0 pg   MCHC 35.0 32.0 - 36.0 g/dL   RDW 13.5 11.5 - 14.5 %   Platelets 201 150 - 440 K/uL  Acetaminophen level     Status: Abnormal   Collection Time: 09/10/15  9:27 PM  Result Value Ref Range   Acetaminophen (Tylenol), Serum <10 (L) 10 - 30 ug/mL    Comment:        THERAPEUTIC CONCENTRATIONS VARY SIGNIFICANTLY. A  RANGE OF 10-30 ug/mL MAY BE AN EFFECTIVE CONCENTRATION FOR MANY PATIENTS. HOWEVER, SOME ARE BEST TREATED AT CONCENTRATIONS OUTSIDE THIS RANGE. ACETAMINOPHEN CONCENTRATIONS >150 ug/mL AT 4 HOURS AFTER INGESTION AND >50 ug/mL AT 12 HOURS AFTER INGESTION ARE OFTEN ASSOCIATED WITH TOXIC REACTIONS.   Salicylate level     Status: None   Collection Time: 09/10/15  9:27 PM  Result Value Ref Range   Salicylate Lvl <6.7 2.8 - 30.0 mg/dL  Urine Drug Screen, Qualitative ([facility] only)     Status: Abnormal   Collection Time: 09/10/15  9:42 PM  Result Value Ref Range   Tricyclic, Ur Screen NONE DETECTED NONE DETECTED   Amphetamines, Ur Screen POSITIVE (A) NONE DETECTED   MDMA (Ecstasy)Ur Screen NONE DETECTED NONE DETECTED   Cocaine Metabolite,Ur [place] POSITIVE (A) NONE DETECTED   Opiate, Ur Screen NONE DETECTED NONE DETECTED   Phencyclidine (PCP) Ur S NONE DETECTED NONE DETECTED   Cannabinoid 50 Ng, Ur [place] NONE DETECTED NONE DETECTED   Barbiturates, Ur Screen NONE DETECTED NONE DETECTED   Benzodiazepine, Ur Scrn POSITIVE (A) NONE DETECTED   Methadone Scn, Ur NONE DETECTED NONE DETECTED    Comment: (NOTE) 591  Tricyclics, urine               Cutoff 1000 ng/mL 200  Amphetamines, urine             Cutoff 1000 ng/mL 300  MDMA (Ecstasy), urine           Cutoff 500 ng/mL 400  Cocaine Metabolite, urine       Cutoff 300 ng/mL 500  Opiate, urine                   Cutoff 300 ng/mL 600  Phencyclidine (PCP), urine      Cutoff 25 ng/mL 700  Cannabinoid, urine              Cutoff 50  ng/mL 800  Barbiturates, urine             Cutoff 200 ng/mL 900  Benzodiazepine, urine           Cutoff 200 ng/mL 1000 Methadone, urine                Cutoff 300 ng/mL 1100 1200 The urine drug screen provides only a preliminary, unconfirmed 1300 analytical test result and should not be used for non-medical 1400 purposes. Clinical consideration and professional judgment should 1500 be applied to any positive drug screen result due to possible 1600 interfering substances. A more specific alternate chemical method 1700 must be used in order to obtain a confirmed analytical result.  1800 Gas chromato graphy / mass spectrometry (GC/MS) is the preferred 1900 confirmatory method.     Blood Alcohol level:  Lab Results  Component Value Date   ETH 14* 63/84/6659    Metabolic Disorder Labs:  No results found for: HGBA1C, MPG No results found for: PROLACTIN No results found for: CHOL, TRIG, HDL, CHOLHDL, VLDL, LDLCALC  Current Medications: Current Facility-Administered Medications  Medication Dose Route Frequency Provider Last Rate Last Dose  . acetaminophen (TYLENOL) tablet 650 mg  650 mg Oral Q6H PRN Donita Brooks, MD   650 mg at 09/12/15 0050  . alum & mag hydroxide-simeth (MAALOX/MYLANTA) 200-200-20 MG/5ML suspension 30 mL  30 mL Oral Q4H PRN Donita Brooks, MD      . haloperidol (HALDOL) tablet 5 mg  5 mg Oral Q6H PRN Donita Brooks, MD  And  . benztropine (COGENTIN) tablet 1 mg  1 mg Oral Q6H PRN Donita Brooks, MD      . chlordiazePOXIDE (LIBRIUM) capsule 25 mg  25 mg Oral Q6H PRN Donita Brooks, MD      . citalopram (CELEXA) tablet 20 mg  20 mg Oral Daily Donita Brooks, MD   20 mg at 09/12/15 9449  . haloperidol (HALDOL) tablet 1 mg  1 mg Oral BID Donita Brooks, MD   1 mg at 09/12/15 6759  . hydrOXYzine (ATARAX/VISTARIL) tablet 25 mg  25 mg Oral Q6H PRN Donita Brooks, MD      . loperamide (IMODIUM) capsule 2-4 mg  2-4 mg Oral PRN Donita Brooks, MD      . magnesium  hydroxide (MILK OF MAGNESIA) suspension 30 mL  30 mL Oral Daily PRN Donita Brooks, MD      . nicotine (NICODERM CQ - dosed in mg/24 hours) patch 21 mg  21 mg Transdermal Daily Himabindu Ravi, MD   21 mg at 09/12/15 0906  . ondansetron (ZOFRAN-ODT) disintegrating tablet 4 mg  4 mg Oral Q6H PRN Donita Brooks, MD      . traZODone (DESYREL) tablet 100 mg  100 mg Oral QHS PRN Donita Brooks, MD       PTA Medications: No prescriptions prior to admission    Musculoskeletal: Strength & Muscle Tone: within normal limits Gait & Station: normal Patient leans: N/A  Psychiatric Specialty Exam: Physical Exam  Constitutional: He is oriented to person, place, and time. He appears well-developed and well-nourished.  HENT:  Head: Normocephalic and atraumatic.  Eyes: Conjunctivae and EOM are normal.  Neck: Normal range of motion.  Respiratory: Effort normal.  Musculoskeletal: Normal range of motion.  Neurological: He is alert and oriented to person, place, and time.    Review of Systems  Constitutional: Negative.   HENT: Negative.   Eyes: Negative.   Respiratory: Negative.   Cardiovascular: Negative.   Gastrointestinal: Positive for nausea.  Genitourinary: Negative.   Musculoskeletal: Negative.   Skin: Negative.   Neurological: Negative.   Psychiatric/Behavioral: Positive for depression and substance abuse.    Blood pressure 112/68, pulse 66, temperature 98.4 F (36.9 C), temperature source Oral, resp. rate 18, height _0  (1.727 m), weight 83.008 kg (183 lb), SpO2 95 %.Body mass index is 27.83 kg/(m^2).  General Appearance: Fairly Groomed  Engineer, water::  Fair  Speech:  Clear and Coherent  Volume:  Normal  Mood:  Dysphoric  Affect:  Constricted  Thought Process:  Linear  Orientation:  Full (Time, Place, and Person)  Thought Content:  Hallucinations: None  Suicidal Thoughts:  No  Homicidal Thoughts:  No  Memory:  Immediate;   Good Recent;   Good Remote;   Good  Judgement:  Poor   Insight:  Shallow  Psychomotor Activity:  Decreased  Concentration:  Fair  Recall:  Good  Fund of Knowledge:Fair  Language: Good  Akathisia:  No  Handed:    AIMS (if indicated):     Assets:  Communication Skills  ADL's:  Intact  Cognition: WNL  Sleep:  Number of Hours: 4.5     Treatment Plan Summary:  Depressive disorder unspecified versus substance-induced mood disorder. Will hold off from starting antidepressants at this point in time as patient has been abusing a multitude of substances. Likely worsening of mood is in the setting of withdrawal from opiates and alcohol.  Insomnia patient will be started on trazodone  100 mg by mouth daily at bedtime   Alcohol withdrawal: No evidence of alcohol withdrawal at this time. His alcohol level at arrival to the emergency department was 14. He says that he hasn't drank heavily in 2 weeks  Opiate withdrawal: Patient reports feeling weak, tired. The last use of opiates was a few days ago. Patient was using Suboxone and heroine per notes. Orders given for self from, Imodium, Vistaril   Patient also positive urine toxicology screen for cocaine, benzodiazepines and amphetamines.  Tobacco use disorder: Continue nicotine patch 21 mg a day  Diet regular  Vital signs daily  Precautions every 15 minute checks  Hospitalization and status will change to voluntary  Discharge disposition patient is very interested in residential treatment for substance abuse.    I certify that inpatient services furnished can reasonably be expected to improve the patient's condition.    Hildred Priest, MD 4/17/20174:25 PM

## 2015-09-12 NOTE — BHH Group Notes (Signed)
BHH Group Notes:  (Nursing/MHT/Case Management/Adjunct)  Date:  09/12/2015  Time:  9:19 PM  Type of Therapy:  Group Therapy  Participation Level:  Active  Participation Quality:  Appropriate  Affect:  Appropriate  Cognitive:  Appropriate  Insight:  Appropriate  Engagement in Group:  Engaged  Modes of Intervention:  Discussion  Summary of Progress/Problems:  Joseph EkJanice Marie Melik Hines 09/12/2015, 9:19 PM
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
## 2015-09-12 NOTE — Progress Notes (Signed)
Recreation Therapy Notes  Date: 04.17.17 Time: 1:00 pm Location: Craft Room  Group Topic: Self-expression  Goal Area(s) Addresses:  Patient will be able to identify a color that represents each emotion. Patient will verbalize benefit of using art as a means of self-expression. Patient will verbalize one emotion experienced while participating in activity.  Behavioral Response: Did not attend  Intervention: The Colors Within Me  Activity: Patients were given a blank face worksheet and instructed to pick a color for each emotion they were experiencing and to show on the face how much of that emotion they are experiencing.  Education: LRT educated patients on other forms of self-expression.  Education Outcome: Patient did not attend group.  Clinical Observations/Feedback: Patient did not attend group.  Nevena Rozenberg M, LRT/CTRS 09/12/2015 2:22 PM 

## 2015-09-12 NOTE — Tx Team (Signed)
Initial Interdisciplinary Treatment Plan   PATIENT STRESSORS: Financial difficulties Occupational concerns Substance abuse   PATIENT STRENGTHS: Average or above average intelligence Communication skills Physical Health   PROBLEM LIST: Problem List/Patient Goals Date to be addressed Date deferred Reason deferred Estimated date of resolution  Suicidal ideations 09/12/2015     Substance abuse 09/12/2015                                                DISCHARGE CRITERIA:  Motivation to continue treatment in a less acute level of care Reduction of life-threatening or endangering symptoms to within safe limits Verbal commitment to aftercare and medication compliance  PRELIMINARY DISCHARGE PLAN: Attend aftercare/continuing care group Placement in alternative living arrangements  PATIENT/FAMIILY INVOLVEMENT: This treatment plan has been presented to and reviewed with the patient, Joseph Hines, and/or family member, .  The patient and family have been given the opportunity to ask questions and make suggestions.  Joseph Hines 09/12/2015, 6:05 PM

## 2015-09-12 NOTE — Progress Notes (Signed)
Patient isolated in the room today.Stated that he is tired he did not sleep good last night.Denies suicidal or homicidal ideations & AV hallucinations.No signs of withdrawal verbalized other than sweating.Compliant with medications.Did not attend groups.

## 2015-09-13 MED ORDER — TRAZODONE HCL 50 MG PO TABS
150.0000 mg | ORAL_TABLET | Freq: Every day | ORAL | Status: DC
  Administered 2015-09-13: 150 mg via ORAL
  Filled 2015-09-13: qty 1

## 2015-09-13 NOTE — Progress Notes (Signed)
[facility]  09/13/2015 9:49 AM Joseph Hines  MRN:  [id number] Subjective:  Patient continues to go through opiate withdrawal. He feels weak, nauseous has abdominal cramps and diarrhea.Pt was unable to fall asleep last night. Emotionally he feels more stable. Denies SI, HI or having auditory or visual hallucinations. Denies side effects from medications. All his physical complaints appear to be related to opiate withdrawal.  Appears motivated for substance abuse treatment.  Per nursing there were no issues overnight. He participated in group in the evening. Plans to attend groups today   Principal Problem: Substance or medication-induced depressive disorder with onset during withdrawal [facility](HCC) Diagnosis:   Patient Active Problem List   Diagnosis Date Noted  . Alcohol use disorder, severe, dependence (HCC) [F10.20] 09/12/2015  . Opioid use disorder, moderate, dependence (HCC) [F11.20] 09/12/2015  . Tobacco use disorder [F17.200] 09/12/2015  . 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 Cocaine use disorder, severe, dependence (HCC) [F14.20] 09/11/2015  . Severe benzodiazepine use disorder [F13.90] 09/11/2015  . 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 Depression   . Traumatic brain injury Joseph Hines(HCC)     Past Surgical History  Procedure Laterality Date  . Femur fracture surgery     Family History: History reviewed. No pertinent family history.  Social History:  History  Alcohol Use  . 14.4 oz/week  . 24 Cans of beer per week    Comment: 5 days a week     History  Drug Use  . Yes  . Special: Cocaine    Comment: suboxene, adderall, klonopin    Social History   Social History  . Marital Status: Single    Spouse Name: N/A  . Number of Children: N/A  . Years of  Education: N/A   Social History Main Topics  . Smoking status: Current Every Day Smoker -- 1.50 packs/day    Types: Cigarettes  . Smokeless tobacco: Never Used  . Alcohol Use: 14.4 oz/week    24 Cans of beer per week     Comment: 5 days a week  . Drug Use: Yes    Special: Cocaine     Comment: suboxene, adderall, klonopin  . Sexual Activity: Yes   Other Topics Concern  . None   Social History Narrative     Current Medications: Current Facility-Administered Medications  Medication Dose Route Frequency Provider Last Rate Last Dose  . acetaminophen (TYLENOL) tablet 650 mg  650 mg Oral Q6H PRN Gena Frayyan G McQueen, MD   650 mg at 09/12/15 2118  . alum & mag hydroxide-simeth (MAALOX/MYLANTA) 200-200-20 MG/5ML suspension 30 mL  30 mL Oral Q4H PRN Gena Frayyan G McQueen, MD      . hydrOXYzine (ATARAX/VISTARIL) tablet 25 mg  25 mg Oral Q6H PRN Jimmy FootmanAndrea Hernandez-Gonzalez, MD      . loperamide (IMODIUM) capsule 2-4 mg  2-4 mg Oral PRN Gena Frayyan G McQueen, MD      . magnesium hydroxide (MILK OF MAGNESIA) suspension 30 mL  30 mL Oral Daily PRN Gena Frayyan G McQueen, MD      . nicotine (NICODERM CQ - dosed in mg/24 hours) patch 21 mg  21 mg Transdermal Daily Himabindu Ravi, MD   21 mg at 09/13/15 0847  . ondansetron (ZOFRAN-ODT) disintegrating tablet 4 mg  4 mg Oral Q6H PRN Gena Frayyan G McQueen,  MD      . traZODone (DESYREL) tablet 100 mg  100 mg Oral QHS Jimmy Footman, MD   100 mg at 09/12/15 2117    Lab Results: No results found for this or any previous visit (from the past 48 hour(s)).  Blood Alcohol level:  Lab Results  Component Value Date   ETH 14* 09/10/2015    Physical Findings: AIMS:  , ,  ,  , Dental Status Current problems with teeth and/or dentures?: No Does patient usually wear dentures?: No  CIWA:  CIWA-Ar Total: 2 COWS:     Musculoskeletal: Strength & Muscle Tone: within normal limits Gait & Station: normal Patient leans: N/A  Psychiatric Specialty Exam: Review of Systems   Constitutional: Negative.   HENT: Negative.   Eyes: Negative.   Respiratory: Negative.   Cardiovascular: Negative.   Gastrointestinal: Negative.   Genitourinary: Negative.   Skin: Negative.   Neurological: Negative.   Endo/Heme/Allergies: Negative.   Psychiatric/Behavioral: Positive for depression and substance abuse.    Blood pressure 111/66, pulse 75, temperature 98 F (36.7 C), temperature source Oral, resp. rate 18, height  (1.727 m), weight 83.008 kg (183 lb), SpO2 95 %.Body mass index is 27.83 kg/(m^2).  General Appearance: Fairly Groomed  Patent attorney::  Fair  Speech:  Clear and Coherent  Volume:  Decreased  Mood:  Dysphoric  Affect:  Constricted  Thought Process:  Linear  Orientation:  Full (Time, Place, and Person)  Thought Content:  Hallucinations: None  Suicidal Thoughts:  No  Homicidal Thoughts:  No  Memory:  Immediate;   Good Recent;   Good Remote;   Good  Judgement:  Fair  Insight:  Fair  Psychomotor Activity:  Decreased  Concentration:  Fair  Recall:  Fair  Fund of Knowledge:Good  Language: Good  Akathisia:  No  Handed:    AIMS (if indicated):     Assets:  Manufacturing systems engineer Physical Health Social Support  ADL's:  Intact  Cognition: WNL  Sleep:  Number of Hours: 4.5   Treatment Plan Summary:  Depressive disorder unspecified versus substance-induced mood disorder. Will hold off from starting antidepressants at this point in time as patient has been abusing a multitude of substances. Likely worsening of mood is in the setting of withdrawal from opiates and alcohol.  Insomnia: Patient did not sleep well last night I will increase the trazodone 150 mg by mouth daily at bedtime  Alcohol withdrawal: No evidence of alcohol withdrawal at this time. His alcohol level at arrival to the emergency department was 14. He says that he hasn't drank heavily in 2 weeks  Opiate withdrawal: Patient reports feeling weak, tired. The last use of opiates was a few  days ago. Patient was using Suboxone and heroine per notes. Orders given for NSAIDS, Imodium, Vistaril  and Zofran   Patient also positive urine toxicology screen for cocaine, benzodiazepines and amphetamines.  Tobacco use disorder: Continue nicotine patch 21 mg a day  Diet regular  Vital signs daily  Precautions every 15 minute checks  Hospitalization and status will change to voluntary  Discharge disposition patient is very interested in residential treatment for substance abuse. Referral has been made to [facility] house.  Will likely be d/c in the next 24-48 h   Jimmy Footman, MD 09/13/2015, 9:49 AM

## 2015-09-13 NOTE — [facility] Group Notes (Signed)
[facility]  09/13/2015 4:59 PM  Type of Therapy:  Group Therapy  Participation Level:  Did Not Attend    Glennon MacLaws, Inella Kuwahara P, MSw, LCSW 09/13/2015, 4:59 PM

## 2015-09-13 NOTE — Plan of Care (Signed)
Problem: Ineffective individual coping Goal: STG: Patient will remain free from self harm Outcome: Progressing Patient denies SI/HI and contracts for safety.

## 2015-09-13 NOTE — Progress Notes (Signed)
Recreation Therapy Notes  Date: 04.18.17 Time: 3:00 pm Location: Craft Room  Group Topic: Goal Setting  Goal Area(s) Addresses:  Patient will write at least one goal. Patient will write at least one obstacle.  Behavioral Response: Did not attend  Intervention: Recovery Goal Chart  Activity: Patients were instructed to make a Recovery Goal chart including goals, obstacles, the date they started working on their goals, and the date they will achieve their goals.  Education: LRT educated patients on healthy ways to celebrate reaching their goals.  Education Outcome: Patient did not attend group.  Clinical Observations/Feedback: Patient did not attend group.  Jacquelynn CreeGreene,Azra Abrell M, LRT/CTRS 09/13/2015 4:24 PM

## 2015-09-13 NOTE — Progress Notes (Signed)
D: Patient denies SI/HI/AVH.  Patient affect and mood are anxious.  Patient did attend evening group. Patient visible on the milieu. No distress noted. A: Support and encouragement offered. Scheduled medications given to pt. Q 15 min checks continued for patient safety. R: Patient receptive. Patient remains safe on the unit.   

## 2015-09-13 NOTE — Plan of Care (Signed)
Problem: [facility] Participation in Recreation Therapeutic Interventions Goal: STG-Patient will demonstrate improved self esteem by identif STG: Self-Esteem - Within 4 treatment sessions, patient will verbalize at least 5 positive affirmation statements in each of 2 treatment sessions to increase self-esteem post d/c.  Outcome: Progressing Treatment Session 1; Completed 1 out of 2: At approximately 11:45 am, LRT met with patient in patient room. Patient verbalized 5 positive affirmation statements. Patient reported it felt "good". LRT encouraged patient to continue saying positive affirmation statements.  Leonette Monarch, LRT/CTRS 04.18.17 12:02 pm Goal: STG-Patient will identify at least five coping skills for ** STG: Coping Skills - Within 4 treatment sessions, patient will verbalize at least 5 coping skills for substance abuse in each of 2 treatment sessions to decrease substance abuse post d/c.  Outcome: Progressing Treatment Session 1; Completed 1 out of 2: At approximately 11:45 am, LRT met with patient in patient room. Patient verbalized 5 coping skills for substance abuse. LRT educated patient on leisure and why it is important to implement into his schedule. LRT educated and provided patient with blank schedules to help him plan his day and try to avoid using substances. LRT educated patient on healthy support systems.  Leonette Monarch, LRT/CTRS 04.18.17 12:04 pm

## 2015-09-13 NOTE — [facility] Group Notes (Signed)
[facility] Group Notes:  (Nursing/MHT/Case Management/Adjunct)  Date:  09/13/2015  Time:  2:19 PM  Type of Therapy:  Psychoeducational Skills  Participation Level:  Did Not Attend   Lynelle SmokeCara Travis [facility] CenterMadoni 09/13/2015, 2:19 PM

## 2015-09-13 NOTE — Progress Notes (Signed)
Patient remained pleasant and cooperative. He stayed mostly in bed, attended only one group where he interacted appropriately with peers. Denied SI/HI/AH/VH and contracted for safety.

## 2015-09-14 MED ORDER — TUBERCULIN PPD 5 UNIT/0.1ML ID SOLN
5.0000 [IU] | Freq: Once | INTRADERMAL | Status: DC
  Filled 2015-09-14: qty 0.1

## 2015-09-14 MED ORDER — TRAZODONE HCL 150 MG PO TABS: 150.0000 mg | ORAL_TABLET | Freq: Every day | ORAL | Status: DC

## 2015-09-14 NOTE — [facility] Suicide Risk Assessment (Signed)
[facility]   Principal Problem: Substance or medication-induced depressive disorder with onset during withdrawal [facility](HCC) Discharge Diagnoses:  Patient Active Problem List   Diagnosis Date Noted  . Alcohol use disorder, severe, dependence (HCC) [F10.20] 09/12/2015  . Opioid use disorder, moderate, dependence (HCC) [F11.20] 09/12/2015  . Tobacco use disorder [F17.200] 09/12/2015  . 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 Cocaine use disorder, severe, dependence (HCC) [F14.20] 09/11/2015  . Severe benzodiazepine use disorder [F13.90] 09/11/2015  . 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 rate 18, height 5\' 8"  (1.727 m), weight 83.008 kg (183 lb), SpO2 95 %.Body mass index is 27.83 kg/(m^2).                                                       Mental Status Per Nursing Assessment::   On Admission:  Suicidal ideation indicated by patient, Self-harm thoughts  Demographic Factors:  Male, Caucasian, Low socioeconomic status, Living alone and Unemployed  Loss Factors: Legal issues and Financial problems/change in socioeconomic status  Historical Factors: Impulsivity  Risk Reduction Factors:   Positive social support  Continued Clinical Symptoms:  Alcohol/Substance Abuse/Dependencies  Cognitive Features That Contribute To Risk:  None    Suicide Risk:  Minimal: No identifiable suicidal ideation.  Patients presenting with no risk factors but with morbid ruminations; may be classified as minimal risk based on the severity of the depressive symptoms   Joseph FootmanHernandez-Gonzalez,  Joseph Wendling, MD 09/14/2015, 9:48 AM

## 2015-09-14 NOTE — Plan of Care (Signed)
Problem: [facility] Participation in Recreation Therapeutic Interventions Goal: STG-Patient will demonstrate improved self esteem by identif STG: Self-Esteem - Within 4 treatment sessions, patient will verbalize at least 5 positive affirmation statements in each of 2 treatment sessions to increase self-esteem post d/c.  Outcome: Completed/Met Date Met:  09/14/15 Treatment Session 2; Completed 2 out of 2: At approximately 11:40 am, LRT met with patient in patient room. Patient verbalized 5 positive affirmation statements. Patient reported it felt "good". LRT encouraged patient to continue saying positive affirmation statements.  Leonette Monarch, LRT/CTRS 04.19.17 12:06 pm Goal: STG-Patient will identify at least five coping skills for ** STG: Coping Skills - Within 4 treatment sessions, patient will verbalize at least 5 coping skills for substance abuse in each of 2 treatment sessions to decrease substance abuse post d/c.  Outcome: Completed/Met Date Met:  09/14/15 Treatment Session 2; Completed 2 out of 2: At approximately 11:40 am, LRT met with patient in patient room. Patient verbalized 5 coping skills for substance abuse. LRT encouraged patient to participate in leisure activities.  Leonette Monarch, LRT/CTRS 04.19.17 12:07 pm

## 2015-09-14 NOTE — Progress Notes (Signed)
Pleasant and cooperative. Visible [place]. Attends group. Med compliant. Interacting with peers and staff. No s/s of W/D noted. Denies SI/HI/AV/H. No c/o pain/discomfort noted.

## 2015-09-14 NOTE — Progress Notes (Signed)
Patient denies SI/HI, denies A/V hallucinations. Patient verbalizes understanding of discharge instructions, follow up care and prescriptions. Patient given all belongings from  [employer].Patient did not want to wait for the 7 days medicine from the pharmacy.Stated that he will come back & pick it up. Patient escorted out by staff, transported by family.

## 2015-09-14 NOTE — Discharge Summary (Signed)
Physician Discharge Summary Note  Patient:  Joseph Hines is an 30 y.o., male MRN:  [id number] DOB:  Apr 04, 1986 Patient phone:  [phone] (home)  Patient address:   [address] [place] [address],  Total Time spent with patient: 30 minutes  Date of Admission:  09/11/2015 Date of Discharge: 09/14/15  Reason for Admission:  SI  Principal Problem: Substance or medication-induced depressive disorder with onset during withdrawal [facility]) Discharge Diagnoses: Patient Active Problem List   Diagnosis Date Noted  . Alcohol use disorder, severe, dependence ([place]) [F10.20] 09/12/2015  . Opioid use disorder, moderate, dependence ([place]) [F11.20] 09/12/2015  . Tobacco use disorder [F17.200] 09/12/2015  . Substance or medication-induced depressive disorder with onset during withdrawal ([place]) [F19.94] 09/12/2015  . Cocaine use disorder, severe, dependence ([place]) [F14.20] 09/11/2015  . Severe benzodiazepine use disorder [F13.90] 09/11/2015  . Amphetamine use disorder, severe, dependence ([place]) [F15.20] 09/11/2015    History of Present Illness:  Joseph Hines is a 30 y.o. male patient with a h/o depression and substance abuse who presented to [facility] ED on 09-11-15 due to 2 hours of SI prior to ED arrival. The pt denied to the ED provider that he tried to take his life. He reported to ED nursing that he is under increased stress due to not being able to find employment. He shared with ED nursing, "I don't really want to do anything, but I have these thoughts."  He uses heroin (IV/snorting), cocaine (smoking/snorting), alcohol and Suboxone (he places the strips under his tongue). Pt reported a past overdose on heroin. He also reported past detox treatment which was helpful while enrolled in a substance abuse treatment program. Shortly after discharge from substance abuse treatment, the pt relapsed.   Current triggers of depression include no supports, unemployment, past/current legal charges & conflict with  current relationship with his girlfriend. Pt's girlfriend was unfaithful and now the pt has nowhere to live as he was living with his girlfriend prior to admission. Due to releases from prison in 2007 and releases from county jail last year, he finds it difficult to adjust to community living. He has an upcoming court date on 10-04-2015 for hit and run/property damage.   His longest sobriety was when he was in prison for 1 year.   He notes he drinks one case of beer daily. He denies SI, HI and AVH. Pt is asking for help to treat depression as well as substance abuse.   Today the patient tells me that he is stopped drinking alcohol about 2 weeks ago and therefore he is not withdrawing from at. However he said he was drinking up to a case of beer per day. The patient tells me that the major issue right now is opiates he has been using Suboxone which he purchases on the street. The patient is currently divorced and symptoms of opiate withdrawal. Patient is very interested in residential treatment for substance abuse. He says that he has failed outpatient treatment in the past before.  Associated Signs/Symptoms: Depression Symptoms: depressed mood, recurrent thoughts of death, (Hypo) Manic Symptoms: Impulsivity, Anxiety Symptoms: denies Psychotic Symptoms: denies PTSD Symptoms: NA   Past Psychiatric History: Patient reports being hospitalized here in 2009.   Records there is a visiting 2006 and another one in 2012. I'm not able to access to records. There is only the admission diagnoses which is opiate detox. No history of suicidal attempts   Past Medical History:  Past Medical History  Diagnosis Date  . Depression   .  Traumatic brain injury [facility])     Past Surgical History  Procedure Laterality Date  . Femur fracture surgery     Family History: History reviewed. No pertinent family history.  Social History:  History  Alcohol Use  . 14.4 oz/week  . 24 Cans of beer per week     Comment: 5 days a week     History  Drug Use  . Yes  . Special: Cocaine    Comment: suboxene, adderall, klonopin    Social History   Social History  . Marital Status: Single    Spouse Name: N/A  . Number of Children: N/A  . Years of Education: N/A   Social History Main Topics  . Smoking status: Current Every Day Smoker -- 1.50 packs/day    Types: Cigarettes  . Smokeless tobacco: Never Used  . Alcohol Use: 14.4 oz/week    24 Cans of beer per week     Comment: 5 days a week  . Drug Use: Yes    Special: Cocaine     Comment: suboxene, adderall, klonopin  . Sexual Activity: Yes   Other Topics Concern  . None   Social History Narrative    Hospital Course:    Depressive disorder unspecified versus substance-induced mood disorder. Will hold off from starting antidepressants at this point in time as patient has been abusing a multitude of substances. Likely worsening of mood is in the setting of withdrawal from opiates and alcohol.  Insomnia: continue trazodone 150 mg po qhs  Alcohol withdrawal: No evidence of alcohol withdrawal at this time. His alcohol level at arrival to the emergency department was 14. He says that he hasn't drank heavily in 2 weeks  Opiate withdrawal: Patient reports feeling weak, tired. The last use of opiates was a few days ago. Patient was using Suboxone and heroine per notes. Orders given for NSAIDS, Imodium, Vistaril and Zofran   Patient also positive urine toxicology screen for cocaine, benzodiazepines and amphetamines.  Tobacco use disorder: pt received nicotine path 21 mg  Discharge disposition patient is very interested in residential treatment for substance abuse. Referral has been made to [facility]. He was accepted for admission there today.  On admission the patient reported improvement in mood. He no longer was voicing suicidality. He denied homicidality or having auditory or visual hallucinations. He denied major problems with appetite  or sleep. However he was still reporting some mild symptoms of withdrawal and he felt that his energy was low. He was still now she stated at times he had some abdominal cramps. The patient was motivated for treatment. He was excited about the plans of going to [facility].  Patient did not display any disruptive or unsafe behavior during his stay in the unit. There was minimal for seclusion or restraints. Patient had fair participation in programming.  Patient's mother agreed with helping him pay the admission fee at [facility]. She was supported and came to pick up the patient upon discharge.  Patient participated today in treatment team.  He did not have any concerns prior to discharge. He was provided with a 30 day prescription for trazodone and a 7 day supply of her medications.  Physical Findings: AIMS:  , ,  ,  , Dental Status Current problems with teeth and/or dentures?: No Does patient usually wear dentures?: No  CIWA:  CIWA-Ar Total: 2 COWS:     Musculoskeletal: Strength & Muscle Tone: within normal limits Gait & Station: normal Patient leans: N/A  Psychiatric Specialty  Exam: Review of Systems  Constitutional: Negative.   HENT: Negative.   Eyes: Negative.   Respiratory: Negative.   Cardiovascular: Negative.   Gastrointestinal: Negative.   Genitourinary: Negative.   Musculoskeletal: Negative.   Skin: Negative.   Neurological: Negative.   Endo/Heme/Allergies: Negative.   Psychiatric/Behavioral: Positive for depression and substance abuse. Negative for suicidal ideas, hallucinations and memory loss. The patient is not nervous/anxious and does not have insomnia.     Blood pressure 104/61, pulse 82, temperature 98.3 F (36.8 C), temperature source Oral, resp. rate 18, height 5' 8"  (1.727 m), weight 83.008 kg (183 lb), SpO2 95 %.Body mass index is 27.83 kg/(m^2).  General Appearance: Well Groomed  Engineer, water::  Good  Speech:  Clear and Coherent  Volume:  Normal  Mood:  Dysphoric   Affect:  Congruent  Thought Process:  Logical  Orientation:  Full (Time, Place, and Person)  Thought Content:  Hallucinations: None  Suicidal Thoughts:  No  Homicidal Thoughts:  No  Memory:  Immediate;   Good Recent;   Good Remote;   Good  Judgement:  Fair  Insight:  Fair  Psychomotor Activity:  Normal  Concentration:  Good  Recall:  Good  Fund of Knowledge:Good  Language: Good  Akathisia:  No  Handed:    AIMS (if indicated):     Assets:  Armed forces logistics/support/administrative officer Physical Health  ADL's:  Intact  Cognition: WNL  Sleep:  Number of Hours: 7.45   Have you used any form of tobacco in the last 30 days? (Cigarettes, Smokeless Tobacco, Cigars, and/or Pipes): Yes  Has this patient used any form of tobacco in the last 30 days? (Cigarettes, Smokeless Tobacco, Cigars, and/or Pipes) Yes, Yes, A prescription for an FDA-approved tobacco cessation medication was offered at discharge and the patient refused  Blood Alcohol level:  Lab Results  Component Value Date   [facility] 14* 09/10/2015   Results for REMIEL, CORTI (MRN [id number]) as of 09/14/2015 13:42  Ref. Range 09/10/2015 21:27 09/10/2015 21:42  Sodium Latest Ref Range: 135-145 mmol/L 137   Potassium Latest Ref Range: 3.5-5.1 mmol/L 3.4 (L)   Chloride Latest Ref Range: 101-111 mmol/L 102   CO2 Latest Ref Range: 22-32 mmol/L 30   BUN Latest Ref Range: 6-20 mg/dL 14   Creatinine Latest Ref Range: 0.61-1.24 mg/dL 1.09   Calcium Latest Ref Range: 8.9-10.3 mg/dL 8.9   EGFR (Non-African Amer.) Latest Ref Range: >60 mL/min >60   EGFR (African American) Latest Ref Range: >60 mL/min >60   Glucose Latest Ref Range: 65-99 mg/dL 95   Anion gap Latest Ref Range: 5-15  5   Alkaline Phosphatase Latest Ref Range: 38-126 U/L 62   Albumin Latest Ref Range: 3.5-5.0 g/dL 4.5   AST Latest Ref Range: 15-41 U/L 67 (H)   ALT Latest Ref Range: 17-63 U/L 81 (H)   Total Protein Latest Ref Range: 6.5-8.1 g/dL 6.9   Total Bilirubin Latest Ref Range: 0.3-1.2 mg/dL  0.6   WBC Latest Ref Range: 3.8-10.6 K/uL 5.5   RBC Latest Ref Range: 4.40-5.90 MIL/uL 4.39 (L)   Hemoglobin Latest Ref Range: 13.0-18.0 g/dL 14.1   HCT Latest Ref Range: 40.0-52.0 % 40.2   MCV Latest Ref Range: 80.0-100.0 fL 91.5   MCH Latest Ref Range: 26.0-34.0 pg 32.0   MCHC Latest Ref Range: 32.0-36.0 g/dL 35.0   RDW Latest Ref Range: 11.5-14.5 % 13.5   Platelets Latest Ref Range: 150-440 K/uL 201   Acetaminophen (Tylenol), S Latest Ref Range: 10-30 ug/mL <10 (  L)   Salicylate Lvl Latest Ref Range: 2.8-30.0 mg/dL <4.0   Alcohol, Ethyl (B) Latest Ref Range: <5 mg/dL 14 (H)   Amphetamines, Ur Screen Latest Ref Range: NONE DETECTED   POSITIVE (A)  Barbiturates, Ur Screen Latest Ref Range: NONE DETECTED   NONE DETECTED  Benzodiazepine, Ur Scrn Latest Ref Range: NONE DETECTED   POSITIVE (A)  Cocaine Metabolite,Ur [place] Latest Ref Range: NONE DETECTED   POSITIVE (A)  Methadone Scn, Ur Latest Ref Range: NONE DETECTED   NONE DETECTED  MDMA (Ecstasy)Ur Screen Latest Ref Range: NONE DETECTED   NONE DETECTED  Cannabinoid 50 Ng, Ur [place] Latest Ref Range: NONE DETECTED   NONE DETECTED  Opiate, Ur Screen Latest Ref Range: NONE DETECTED   NONE DETECTED  Phencyclidine (PCP) Ur S Latest Ref Range: NONE DETECTED   NONE DETECTED  Tricyclic, Ur Screen Latest Ref Range: NONE DETECTED   NONE DETECTED   Metabolic Disorder Labs:  No results found for: HGBA1C, MPG No results found for: PROLACTIN No results found for: CHOL, TRIG, HDL, CHOLHDL, VLDL, LDLCALC  See Psychiatric Specialty Exam and Suicide Risk Assessment completed by Attending Physician prior to discharge.  Discharge destination:  Other:  [facility]  Is patient on multiple antipsychotic therapies at discharge:  No   Has Patient had three or more failed trials of antipsychotic monotherapy by history:  No  Recommended Plan for Multiple Antipsychotic Therapies: NA     Medication List    TAKE these medications      Indication   traZODone 150 MG  tablet  Commonly known as:  DESYREL  Take 1 tablet (150 mg total) by mouth at bedtime.  Notes to Patient:  insomnia        >30 minutes.  >50 % of the time was  spentcoordination of care   Signed: Hildred Priest, MD 09/14/2015, 9:49 AM

## 2015-09-14 NOTE — Tx Team (Signed)
Interdisciplinary Treatment Plan Update (Adult)         Date: 09/14/2015   Time Reviewed: 9:30 AM   Progress in Treatment: Improving Attending groups: Yes  Participating in groups: Yes  Taking medication as prescribed: Yes  Tolerating medication: Yes  Family/Significant other contact made: Yes, Joseph Hines has spoken with the pt's mother Patient understands diagnosis: Yes  Discussing patient identified problems/goals with staff: Yes  Medical problems stabilized or resolved: Yes  Denies suicidal/homicidal ideation: Yes  Issues/concerns per patient self-inventory: Yes  Other:   New problem(s) identified: N/A   Discharge Plan or Barriers: Pt will discharge to [place] to complete intake and admission to the [facility] for long-term residential substance abuse treatment, medication management and therapy   Reason for Continuation of Hospitalization:   Depression   Anxiety   Medication Stabilization   Comments: N/A   Estimated date of discharge: 09/14/15    Patient is a 30 y.o. male patient with a h/o depression and substance abuse who presented to [facility] ED on 09-11-15 due to 2 hours of SI prior to ED arrival.  The pt was was admitted for suicidal ideation and worsening symptoms of depression.  The pt denied to the ED provider that he tried to take his life. He reported to ED nursing that he is under increased stress due to not being able to find employment. He shared with ED nursing, "I don't really want to do anything, but I have these thoughts."  He uses heroin (IV/snorting), cocaine (smoking/snorting), alcohol and Suboxone (he places the strips under his tongue). Pt reported a past overdose on heroin. He also reported past detox treatment which was helpful while enrolled in a substance abuse treatment program. Shortly after discharge from substance abuse treatment, the pt relapsed. Current triggers of depression include no supports, unemployment, past/current legal  charges & conflict with current relationship with his girlfriend. Pt's girlfriend was unfaithful and now the pt has nowhere to live as he was living with his girlfriend prior to admission. Due to releases from prison in 2007 and releases from county jail last year, he finds it difficult to adjust to community living. He has an upcoming court date on 10-04-2015 for hit and run/property damage. His longest sobriety was when he was in prison for 1 year.  He notes he drinks one case of beer daily. He denies SI, HI and AVH. Pt is asking for help to treat depression as well as substance abuse.  Today the patient tells me that he is stopped drinking alcohol about 2 weeks ago and therefore he is not withdrawing from at. However he said he was drinking up to a case of beer per day. The patient tells me that the major issue right now is opiates he has been using Suboxone which he purchases on the street. The patient is currently divorced and symptoms of opiate withdrawal. Patient is very interested in residential treatment for substance abuse. He says that he has failed outpatient treatment in the past before.  Patient will benefit from crisis stabilization, medication evaluation, group therapy, and psycho education in addition to case management for discharge planning. Patient and Joseph Hines reviewed pt's identified goals and treatment plan. Pt verbalized understanding and agreed to treatment plan.    Review of initial/current patient goals per problem list:  1. Goal(s): Patient will participate in aftercare plan   Met: Yes  Target date: 3-5 days post admission date   As evidenced by: Patient will participate within aftercare  plan AEB aftercare provider and housing plan at discharge being identified.   4/19: Pt will discharge to [facility] to complete intake and admission to the [facility] for long-term residential substance abuse treatment, medication management and therapy    2. Goal (s):  Patient will exhibit decreased depressive symptoms and suicidal ideations.   Met: Adequate for discharge per MD.  Target date: 3-5 days post admission date   As evidenced by: Patient will utilize self-rating of depression at 3 or below and demonstrate decreased signs of depression or be deemed stable for discharge by MD.   4/19: Adequate for discharge per MD.  Pt denies SI/HI.  Pt reports he is safe for discharge.    3. Goal(s): Patient will demonstrate decreased signs and symptoms of anxiety.   Met: Adequate for discharge per MD.  Target date: 3-5 days post admission date   As evidenced by: Patient will utilize self-rating of anxiety at 3 or below and demonstrated decreased signs of anxiety, or be deemed stable for discharge by MD   4/19: Adequate for discharge per MD.   4. Goal(s): Patient will demonstrate decreased signs of withdrawal due to substance abuse   Met: Yes  Target date: 3-5 days post admission date   As evidenced by: Patient will produce a CIWA/COWS score of 0, have stable vitals signs, and no symptoms of withdrawal   4/19: Patient produced a CIWA/COWS score of 0, has stable vitals signs, and no symptoms of withdrawal     Attendees:  Patient: Joseph Hines Family:  Physician: Dr. Jerilee Hoh, MD    09/14/2015 10:30 AM  Nursing: , RN       09/14/2015 10:30 AM  Clinical Social Worker: Joseph Hines, [place]  09/14/2015 10:30 AM  Recreational Therapist: Everitt Amber, LRT   09/14/2015 10:30 AM  Psychologist: Byrd Hesselbach Roush   09/14/2015 10:30 AM  Nursing: Carolynn Sayers    09/14/2015 10:30 AM  Other:        09/14/2015 10:30 AM   Alphonse Guild. Kahla Risdon, LCSWA, LCAS 09/14/15

## 2015-09-14 NOTE — Progress Notes (Signed)
Recreation Therapy Notes  INPATIENT RECREATION TR PLAN  Patient Details Name: Loman Logan MRN: [id number] DOB: [birth year] Today's Date: 09/14/2015  Rec Therapy Plan Is patient appropriate for Therapeutic Recreation?: Yes Treatment times per week: At least once a week TR Treatment/Interventions: 1:1 session, Group participation (Comment) (Appropriate pariticpation in daily recreational therapy tx)  Discharge Criteria Pt will be discharged from therapy if:: Treatment goals are met, Discharged Treatment plan/goals/alternatives discussed and agreed upon by:: Patient/family  Discharge Summary Short term goals set: See Care Plan Short term goals met: Complete Progress toward goals comments: One-to-one attended One-to-one attended: Self-esteem, coping skills Reason goals not met: N/A Therapeutic equipment acquired: None Reason patient discharged from therapy: Discharge from hospital Pt/family agrees with progress & goals achieved: Yes Date patient discharged from therapy: 09/14/15   Leonette Monarch, LRT/CTRS 09/14/2015, 2:47 PM

## 2015-09-14 NOTE — Plan of Care (Signed)
Problem: Ineffective individual coping Goal: STG: Patient will remain free from self harm Outcome: Progressing Calm and cooperative. Interacting with peers and staff. Med complaint. No voiced thoughts of hurting himself. Pt remains free from harm.

## 2015-09-14 NOTE — [facility] Suicide Risk Assessment (Signed)
[facility] INPATIENT:  Family/Significant Other Suicide Prevention Education  Suicide Prevention Education:  Patient Refusal for Family/Significant Other Suicide Prevention Education: The patient Joseph Hines has refused to provide written consent for family/significant other to be provided Family/Significant Other Suicide Prevention Education during admission and/or prior to discharge.  Physician notified.  CSW completed SPE with the pt.  Dorothe PeaJonathan F Teshawn Moan 09/14/2015, 2:26 PM

## 2015-09-14 NOTE — Progress Notes (Signed)
  [facility] :  Will you be returning to the same living situation after discharge:  No. Pt will be discharging to [place] to follow up with [facility] for medication management, therapy and substance abuse treatment At discharge, do you have transportation home?: Yes,  pt will be picked up by his mother Do you have the ability to pay for your medications: Yes,  pt will be provided with prescriptions at discharge  Release of information consent forms completed and in the chart;  Patient's signature needed at discharge.  Patient to Follow up at: Follow-up Information    Follow up with [facility] .   Why:  Please arrive for your intake for long-term residential substance abuse treatment, medication managment and therapy upon discharge from the hospital   Contact information:   [address] [place] [address] Phone: [phone] Fax: [phone]      Next level of care provider has access to [facility] Link:no  Safety Planning and Suicide Prevention discussed: Yes,  completed with pt  Have you used any form of tobacco in the last 30 days? (Cigarettes, Smokeless Tobacco, Cigars, and/or Pipes): Yes  Has patient been referred to the Quitline?: Patient refused referral  Patient has been referred for addiction treatment: Yes  Dorothe PeaJonathan F Jorryn Hershberger 09/14/2015, 2:39 PM

## 2015-09-26 ENCOUNTER — Emergency Department (HOSPITAL_COMMUNITY)
Admission: EM | Admit: 2015-09-26 | Discharge: 2015-09-26 | Disposition: A | Discharge status: Home / Self Care | Payer: No Typology Code available for payment source | Attending: Emergency Medicine | Admitting: Emergency Medicine

## 2015-09-26 ENCOUNTER — Encounter (HOSPITAL_COMMUNITY): Payer: Self-pay | Admitting: Emergency Medicine

## 2015-09-26 DIAGNOSIS — F329 Major depressive disorder, single episode, unspecified: Secondary | ICD-10-CM | POA: Insufficient documentation

## 2015-09-26 DIAGNOSIS — Z79899 Other long term (current) drug therapy: Secondary | ICD-10-CM | POA: Insufficient documentation

## 2015-09-26 DIAGNOSIS — F17210 Nicotine dependence, cigarettes, uncomplicated: Secondary | ICD-10-CM | POA: Insufficient documentation

## 2015-09-26 DIAGNOSIS — L0231 Cutaneous abscess of buttock: Secondary | ICD-10-CM | POA: Insufficient documentation

## 2015-09-26 MED ORDER — POVIDONE-IODINE 10 % EX SOLN
CUTANEOUS | Status: AC
  Filled 2015-09-26: qty 118

## 2015-09-26 MED ORDER — SULFAMETHOXAZOLE-TRIMETHOPRIM 800-160 MG PO TABS: 1.0000 | ORAL_TABLET | Freq: Two times a day (BID) | ORAL | Status: AC

## 2015-09-26 MED ORDER — IBUPROFEN 800 MG PO TABS: 800.0000 mg | ORAL_TABLET | Freq: Three times a day (TID) | ORAL | Status: DC

## 2015-09-26 MED ORDER — LIDOCAINE-EPINEPHRINE (PF) 2 %-1:200000 IJ SOLN
10.0000 mL | Freq: Once | INTRAMUSCULAR | Status: AC
  Administered 2015-09-26: 10 mL
  Filled 2015-09-26: qty 20

## 2015-09-26 NOTE — ED Provider Notes (Signed)
CSN: [id number]     Arrival date & time 09/26/15  1048 History  By signing my name below, I, Freida Busmaniana Omoyeni, attest that this documentation has been prepared under the direction and in the presence of Eber HongBrian Jasmin Trumbull, MD . Electronically Signed: Freida Busmaniana Omoyeni, Scribe. 09/26/2015. 11:49 AM.    Chief Complaint  Patient presents with  . Abscess    The history is provided by the patient. No language interpreter was used.    HPI Comments:  Joseph Hines is a 30 y.o. male who presents to the Emergency Department complaining of boil to his left buttock x 2-3 days. He notes associated 7/10 pain to the site.  He denies fever, chills, nausea, and vomiting. He also denies h/o DM and h/o abscesses. No current steroid use.  No alleviating factors noted.  Past Medical History  Diagnosis Date  . Depression   . Traumatic brain injury [facility](HCC)    Past Surgical History  Procedure Laterality Date  . Femur fracture surgery     No family history on file. Social History  Substance Use Topics  . Smoking status: Current Every Day Smoker -- 0.50 packs/day    Types: Cigarettes  . Smokeless tobacco: Never Used  . Alcohol Use: No     Comment: 5 days a week; former    Review of Systems  Constitutional: Negative for fever and chills.  Gastrointestinal: Negative for nausea and vomiting.  Skin: Positive for rash.   Allergies  Review of patient's allergies indicates no known allergies.  Home Medications   Prior to Admission medications   Medication Sig Start Date End Date Taking? Authorizing Provider  traZODone (DESYREL) 150 MG tablet Take 1 tablet (150 mg total) by mouth at bedtime. 09/14/15  Yes Jimmy FootmanAndrea Hernandez-Gonzalez, MD  ibuprofen (ADVIL,MOTRIN) 800 MG tablet Take 1 tablet (800 mg total) by mouth 3 (three) times daily. 09/26/15   Eber HongBrian Iness Pangilinan, MD  sulfamethoxazole-trimethoprim (BACTRIM DS,SEPTRA DS) 800-160 MG tablet Take 1 tablet by mouth 2 (two) times daily. 09/26/15 10/03/15  Eber HongBrian Jaeveon Ashland, MD   BP 118/82  mmHg  Pulse 112  Temp(Src) 98.3 F (36.8 C) (Oral)  Resp 18  Ht 5\' 8"  (1.727 m)  Wt 190 lb (86.183 kg)  BMI 28.90 kg/m2  SpO2 97% Physical Exam  Constitutional: He appears well-developed and well-nourished.  HENT:  Head: Normocephalic and atraumatic.  Eyes: Conjunctivae are normal. Right eye exhibits no discharge. Left eye exhibits no discharge.  Pulmonary/Chest: Effort normal. No respiratory distress.  Neurological: He is alert. Coordination normal.  Skin: Skin is warm and dry. He is not diaphoretic.  Mid left buttock: 5 cm area of erythema and 3 cm area of induration US confirmed central abscess no fluctuance   Psychiatric: He has a normal mood and affect.  Nursing note and vitals reviewed.   ED Course  .Marland Kitchen.Incision and Drainage Date/Time: 09/26/2015 12:41 PM Performed by: Eber HongMILLER, Shamiyah Ngu Authorized by: Eber HongMILLER, Deniel Mcquiston Consent: Verbal consent obtained. Risks and benefits: risks, benefits and alternatives were discussed Consent given by: patient Patient understanding: patient states understanding of the procedure being performed Patient identity confirmed: verbally with patient Time out: Immediately prior to procedure a "time out" was called to verify the correct patient, procedure, equipment, support staff and site/side marked as required. Type: abscess Location: buttock L. Anesthesia: local infiltration Local anesthetic: lidocaine 1% with epinephrine Anesthetic total: 3 ml Patient sedated: no Scalpel size: 11 Incision type: single straight Incision depth: subcutaneous Complexity: simple Drainage: purulent and  bloody Drainage amount: moderate Wound  treatment: wound left open Packing material: 1/4 in iodoform gauze Patient tolerance: Patient tolerated the procedure well with no immediate complications     DIAGNOSTIC STUDIES:  Oxygen Saturation is 97% on RA, normal by my interpretation.    COORDINATION OF CARE:  11:42 AM Discussed treatment plan with pt at bedside  and pt agreed to plan.  EMERGENCY DEPARTMENT US SOFT TISSUE INTERPRETATION "Study: Limited Ultrasound of the noted body part in comments below"  INDICATIONS: Soft tissue infection Multiple views of the body part are obtained with a multi-frequency linear probe  PERFORMED BY:  Myself  IMAGES ARCHIVED?: Yes  SIDE:Left  BODY PART:Other soft tisse (comment in note)  FINDINGS: Abcess present and Cellulitis present  LIMITATIONS:  Body Habitus  INTERPRETATION:  Abcess present and Cellulitis present  COMMENT:  L buttock     Meds given in ED:  Medications  povidone-iodine (BETADINE) 10 % external solution (not administered)  lidocaine-EPINEPHrine (XYLOCAINE W/EPI) 2 %-1:200000 (PF) injection 10 mL (10 mLs Other Given 09/26/15 1211)    New Prescriptions   IBUPROFEN (ADVIL,MOTRIN) 800 MG TABLET    Take 1 tablet (800 mg total) by mouth 3 (three) times daily.   SULFAMETHOXAZOLE-TRIMETHOPRIM (BACTRIM DS,SEPTRA DS) 800-160 MG TABLET    Take 1 tablet by mouth 2 (two) times daily.     MDM   Final diagnoses:  Abscess of buttock, left    I personally performed the services described in this documentation, which was scribed in my presence. The recorded information has been reviewed and is accurate.   Tolerated well, bactrim started, well appearing, Stable for d/c. Understands d/c and return instructions.  Meds given in ED:  Medications  povidone-iodine (BETADINE) 10 % external solution (not administered)  lidocaine-EPINEPHrine (XYLOCAINE W/EPI) 2 %-1:200000 (PF) injection 10 mL (10 mLs Other Given 09/26/15 1211)    New Prescriptions   IBUPROFEN (ADVIL,MOTRIN) 800 MG TABLET    Take 1 tablet (800 mg total) by mouth 3 (three) times daily.   SULFAMETHOXAZOLE-TRIMETHOPRIM (BACTRIM DS,SEPTRA DS) 800-160 MG TABLET    Take 1 tablet by mouth 2 (two) times daily.      Eber Hong, MD 09/26/15 1242

## 2015-09-26 NOTE — ED Notes (Addendum)
Pt c/o multiple "boils." States he has two on RT inner thigh and one on LT buttock. Denies fever/chills. Pt HR in 120s.

## 2015-09-26 NOTE — ED Notes (Signed)
Pt c/o abscess to left buttocks. Erythema/edema noted. Pt also reports 2 small red "bumps" to right thigh. nad noted.

## 2015-09-26 NOTE — Discharge Instructions (Signed)

## 2015-11-20 ENCOUNTER — Emergency Department (HOSPITAL_COMMUNITY): Payer: Self-pay

## 2015-11-20 ENCOUNTER — Emergency Department (HOSPITAL_COMMUNITY)
Admission: EM | Admit: 2015-11-20 | Discharge: 2015-11-20 | Disposition: A | Discharge status: Home / Self Care | Payer: Self-pay | Attending: Emergency Medicine | Admitting: Emergency Medicine

## 2015-11-20 ENCOUNTER — Encounter (HOSPITAL_COMMUNITY): Payer: Self-pay

## 2015-11-20 DIAGNOSIS — L0231 Cutaneous abscess of buttock: Secondary | ICD-10-CM | POA: Insufficient documentation

## 2015-11-20 DIAGNOSIS — F17210 Nicotine dependence, cigarettes, uncomplicated: Secondary | ICD-10-CM | POA: Insufficient documentation

## 2015-11-20 DIAGNOSIS — F329 Major depressive disorder, single episode, unspecified: Secondary | ICD-10-CM | POA: Insufficient documentation

## 2015-11-20 DIAGNOSIS — M25441 Effusion, right hand: Secondary | ICD-10-CM | POA: Insufficient documentation

## 2015-11-20 LAB — CBC WITH DIFFERENTIAL/PLATELET
BASOS ABS: 0 10*3/uL (ref 0.0–0.1)
BASOS PCT: 0 %
EOS ABS: 0.1 10*3/uL (ref 0.0–0.7)
Eosinophils Relative: 1 %
HCT: 43.8 % (ref 39.0–52.0)
Hemoglobin: 14.8 g/dL (ref 13.0–17.0)
Lymphocytes Relative: 11 %
Lymphs Abs: 1.4 10*3/uL (ref 0.7–4.0)
MCH: 31.4 pg (ref 26.0–34.0)
MCHC: 33.8 g/dL (ref 30.0–36.0)
MCV: 93 fL (ref 78.0–100.0)
MONO ABS: 1.1 10*3/uL — AB (ref 0.1–1.0)
MONOS PCT: 9 %
NEUTROS PCT: 79 %
Neutro Abs: 9.5 10*3/uL — ABNORMAL HIGH (ref 1.7–7.7)
Platelets: 141 10*3/uL — ABNORMAL LOW (ref 150–400)
RBC: 4.71 MIL/uL (ref 4.22–5.81)
RDW: 12.6 % (ref 11.5–15.5)
WBC: 12.1 10*3/uL — ABNORMAL HIGH (ref 4.0–10.5)

## 2015-11-20 LAB — BASIC METABOLIC PANEL
Anion gap: 5 (ref 5–15)
BUN: 16 mg/dL (ref 6–20)
CALCIUM: 8.8 mg/dL — AB (ref 8.9–10.3)
CO2: 29 mmol/L (ref 22–32)
CREATININE: 0.99 mg/dL (ref 0.61–1.24)
Chloride: 103 mmol/L (ref 101–111)
GLUCOSE: 91 mg/dL (ref 65–99)
Potassium: 4.1 mmol/L (ref 3.5–5.1)
Sodium: 137 mmol/L (ref 135–145)

## 2015-11-20 MED ORDER — VANCOMYCIN HCL IN DEXTROSE 1-5 GM/200ML-% IV SOLN
1000.0000 mg | Freq: Once | INTRAVENOUS | Status: AC
  Administered 2015-11-20: 1000 mg via INTRAVENOUS
  Filled 2015-11-20: qty 200

## 2015-11-20 MED ORDER — IBUPROFEN 800 MG PO TABS: 800.0000 mg | ORAL_TABLET | Freq: Three times a day (TID) | ORAL | Status: DC

## 2015-11-20 MED ORDER — SULFAMETHOXAZOLE-TRIMETHOPRIM 800-160 MG PO TABS: 1.0000 | ORAL_TABLET | Freq: Two times a day (BID) | ORAL | Status: AC

## 2015-11-20 MED ORDER — LIDOCAINE HCL (PF) 2 % IJ SOLN
10.0000 mL | Freq: Once | INTRAMUSCULAR | Status: AC
  Administered 2015-11-20: 10 mL
  Filled 2015-11-20: qty 10

## 2015-11-20 MED ORDER — KETOROLAC TROMETHAMINE 30 MG/ML IJ SOLN
30.0000 mg | Freq: Once | INTRAMUSCULAR | Status: AC
  Administered 2015-11-20: 30 mg via INTRAVENOUS
  Filled 2015-11-20: qty 1

## 2015-11-20 NOTE — ED Provider Notes (Signed)
INCISION AND DRAINAGE Performed by: ZOXWR,UEAVWSOFIA,KAREN Consent: Verbal consent obtained. Risks and benefits: risks, benefits and alternatives were discussed Type: abscess  Body area: buttock  Anesthesia: local infiltration   Incision was made with a scalpel.  Local anesthetic: lidocaine 5% with epinephrine  Anesthetic totall: 5 ml  Complexity: complex Blunt dissection to break up loculations  Drainage: purulent  Drainage amount: small  Packing material: 1/4 in iodoform gauze  Patient tolerance: Patient tolerated the procedure well with no immediate complications.    Lonia SkinnerLeslie K FloridaSofia, PA-C 11/20/15 1150  Loren Raceravid Yelverton, MD 11/25/15 Rickey [id number]

## 2015-11-20 NOTE — Discharge Instructions (Signed)
Abscess °An abscess is an infected area that contains a collection of pus and debris. It can occur in almost any part of the body. An abscess is also known as a furuncle or boil. °CAUSES  °An abscess occurs when tissue gets infected. This can occur from blockage of oil or sweat glands, infection of hair follicles, or a minor injury to the skin. As the body tries to fight the infection, pus collects in the area and creates pressure under the skin. This pressure causes pain. People with weakened immune systems have difficulty fighting infections and get certain abscesses more often.  °SYMPTOMS °Usually an abscess develops on the skin and becomes a painful mass that is red, warm, and tender. If the abscess forms under the skin, you may feel a moveable soft area under the skin. Some abscesses break open (rupture) on their own, but most will continue to get worse without care. The infection can spread deeper into the body and eventually into the bloodstream, causing you to feel ill.  °DIAGNOSIS  °Your caregiver will take your medical history and perform a physical exam. A sample of fluid may also be taken from the abscess to determine what is causing your infection. °TREATMENT  °Your caregiver may prescribe antibiotic medicines to fight the infection. However, taking antibiotics alone usually does not cure an abscess. Your caregiver may need to make a small cut (incision) in the abscess to drain the pus. In some cases, gauze is packed into the abscess to reduce pain and to continue draining the area. °HOME CARE INSTRUCTIONS  °· Only take over-the-counter or prescription medicines for pain, discomfort, or fever as directed by your caregiver. °· If you were prescribed antibiotics, take them as directed. Finish them even if you start to feel better. °· If gauze is used, follow your caregiver's directions for changing the gauze. °· To avoid spreading the infection: °· Keep your draining abscess covered with a  bandage. °· Wash your hands well. °· Do not share personal care items, towels, or whirlpools with others. °· Avoid skin contact with others. °· Keep your skin and clothes clean around the abscess. °· Keep all follow-up appointments as directed by your caregiver. °SEEK MEDICAL CARE IF:  °· You have increased pain, swelling, redness, fluid drainage, or bleeding. °· You have muscle aches, chills, or a general ill feeling. °· You have a fever. °MAKE SURE YOU:  °· Understand these instructions. °· Will watch your condition. °· Will get help right away if you are not doing well or get worse. °  °This information is not intended to replace advice given to you by your health care provider. Make sure you discuss any questions you have with your health care provider. °  °Document Released: 02/21/2005 Document Revised: 11/13/2011 Document Reviewed: 07/27/2011 °Elsevier Interactive Patient Education ©2016 [employer] ° °Incision and Drainage °Incision and drainage is a procedure in which a sac-like structure (cystic structure) is opened and drained. The area to be drained usually contains material such as pus, fluid, or blood.  °LET YOUR CAREGIVER KNOW ABOUT:  °· Allergies to medicine. °· Medicines taken, including vitamins, herbs, eyedrops, over-the-counter medicines, and creams. °· Use of steroids (by mouth or creams). °· Previous problems with anesthetics or numbing medicines. °· History of bleeding problems or blood clots. °· Previous surgery. °· Other health problems, including diabetes and kidney problems. °· Possibility of pregnancy, if this applies. °RISKS AND COMPLICATIONS °· Pain. °· Bleeding. °· Scarring. °· Infection. °BEFORE THE PROCEDURE  °  You may need to have an ultrasound or other imaging tests to see how large or deep your cystic structure is. Blood tests may also be used to determine if you have an infection or how severe the infection is. You may need to have a tetanus shot. °PROCEDURE  °The affected area  is cleaned with a cleaning fluid. The cyst area will then be numbed with a medicine (local anesthetic). A small incision will be made in the cystic structure. A syringe or catheter may be used to drain the contents of the cystic structure, or the contents may be squeezed out. The area will then be flushed with a cleansing solution. After cleansing the area, it is often gently packed with a gauze or another wound dressing. Once it is packed, it will be covered with gauze and tape or some other type of wound dressing.  °AFTER THE PROCEDURE  °· Often, you will be allowed to go home right after the procedure. °· You may be given antibiotic medicine to prevent or heal an infection. °· If the area was packed with gauze or some other wound dressing, you will likely need to come back in 1 to 2 days to get it removed. °· The area should heal in about 14 days. °  °This information is not intended to replace advice given to you by your health care provider. Make sure you discuss any questions you have with your health care provider. °  °Document Released: 11/07/2000 Document Revised: 11/13/2011 Document Reviewed: 07/09/2011 °Elsevier Interactive Patient Education ©2016 Elsevier Inc. ° °

## 2015-11-20 NOTE — ED Notes (Signed)
Pt reports has abscess to r hand and 2 to buttocks x 3 days.

## 2015-11-20 NOTE — ED Provider Notes (Signed)
CSN: [id number]     Arrival date & time 11/20/15  [id number] History  By signing my name below, I, Majel HomerPeyton Lee, attest that this documentation has been prepared under the direction and in the presence of Loren Raceravid Leilan Bochenek, MD . Electronically Signed: Majel HomerPeyton Lee, Scribe. 11/20/2015. 8:27 AM.   Chief Complaint  Patient presents with  . Abscess   The history is provided by the patient. No language interpreter was used.   HPI Comments: Nanda QuintonJustin Fontaine is a 30 y.o. male who presents to the Emergency Department complaining of gradually worsening, swelling and redness to right hand and left buttock that began three days ago. He reports the swelling on his hand began as a small pimple that he popped. Patient now is unable to fully extend the fingers of the right hand and is having discomfort over the dorsum of the hand. Pt denies fever and any other complaints. No nausea or fatigue. Patient was seen several months ago and had an abscess to the buttock strain. States he now has 2 areas of redness and tenderness to the left buttock. No spontaneous drainage. Patient denies injection drug use or animal bites. No known trauma.  Past Medical History  Diagnosis Date  . Depression   . Traumatic brain injury [facility](HCC)    Past Surgical History  Procedure Laterality Date  . Femur fracture surgery     No family history on file. Social History  Substance Use Topics  . Smoking status: Current Every Day Smoker -- 0.50 packs/day    Types: Cigarettes  . Smokeless tobacco: Never Used  . Alcohol Use: No     Comment: 5 days a week; former    Review of Systems  Constitutional: Negative for fever, chills and fatigue.  Gastrointestinal: Negative for nausea, vomiting, abdominal pain, diarrhea and constipation.  Musculoskeletal: Positive for joint swelling (swelling surrounding abscess on hand). Negative for arthralgias, neck pain and neck stiffness.  Skin: Positive for color change. Negative for wound.  Neurological: Negative  for dizziness, weakness, light-headedness, numbness and headaches.  All other systems reviewed and are negative.  Allergies  Review of patient's allergies indicates no known allergies.  Home Medications   Prior to Admission medications   Medication Sig Start Date End Date Taking? Authorizing Provider  ibuprofen (ADVIL,MOTRIN) 800 MG tablet Take 1 tablet (800 mg total) by mouth 3 (three) times daily. 11/20/15   Elson AreasLeslie K Sofia, PA-C  sulfamethoxazole-trimethoprim (BACTRIM DS,SEPTRA DS) 800-160 MG tablet Take 1 tablet by mouth 2 (two) times daily. 11/20/15 11/27/15  Elson AreasLeslie K Sofia, PA-C  traZODone (DESYREL) 150 MG tablet Take 1 tablet (150 mg total) by mouth at bedtime. Patient not taking: Reported on 11/20/2015 09/14/15   Jimmy FootmanAndrea Hernandez-Gonzalez, MD   Triage Vitals: BP 121/74 mmHg  Pulse 102  Temp(Src) 97.9 F (36.6 C) (Oral)  Resp 18  Ht 5\' 9"  (1.753 m)  Wt 215 lb (97.523 kg)  BMI 31.74 kg/m2  SpO2 98% Physical Exam  Constitutional: He is oriented to person, place, and time. He appears well-developed and well-nourished. No distress.  HENT:  Head: Normocephalic and atraumatic.  Mouth/Throat: Oropharynx is clear and moist. No oropharyngeal exudate.  Eyes: EOM are normal. Pupils are equal, round, and reactive to light.  Neck: Normal range of motion. Neck supple.  Cardiovascular: Normal rate and regular rhythm.  Exam reveals no gallop and no friction rub.   No murmur heard. Pulmonary/Chest: Effort normal and breath sounds normal. No respiratory distress. He has no wheezes. He has no rales.  He exhibits no tenderness.  Abdominal: Soft. Bowel sounds are normal. He exhibits no distension and no mass. There is no tenderness. There is no rebound and no guarding.  Musculoskeletal: He exhibits edema and tenderness.  Patient has a lesion at the base of the fourth digit of the right hand on the dorsal surface that is erythematous with surrounding induration roughly 2 cm. He has associated swelling  and redness extending to the wrist. There is tenderness to palpation over the extensor tendons. Patient is unable to fully extend the third fourth and fifth digits. There is no tracking up the forearm. He has no axillary lymphadenopathy. Patient has 2 areas of redness to the L buttocks. The first is more lateral and roughly 3 cm in diameter. There is some induration but no fluctuance. There is also more medially located lesion that is roughly 4 cm in diameter. Induration and questionable fluctuance. Both are tender to palpation. There is no spontaneous drainage.  Neurological: He is alert and oriented to person, place, and time.  Sensation fully intact. 5/5 motor in all extremities though range of motion is limited in the right hand and wrist due to swelling.  Skin: Skin is warm and dry. No rash noted. No erythema.  Psychiatric: He has a normal mood and affect. His behavior is normal.  Nursing note and vitals reviewed.   ED Course  Procedures  DIAGNOSTIC STUDIES:  Oxygen Saturation is 98% on RA, normal by my interpretation.    COORDINATION OF CARE:  11:22 AM Discussed treatment plan with pt at bedside and pt agreed to plan.  Labs Review Labs Reviewed  BASIC METABOLIC PANEL - Abnormal; Notable for the following:    Calcium 8.8 (*)    All other components within normal limits  CBC WITH DIFFERENTIAL/PLATELET - Abnormal; Notable for the following:    WBC 12.1 (*)    Platelets 141 (*)    Neutro Abs 9.5 (*)    Monocytes Absolute 1.1 (*)    All other components within normal limits    Imaging Review Dg Hand Complete Right  11/20/2015  CLINICAL DATA:  Infected hair follicle RIGHT ring finger near MCP joint with redness Friday night, with extension of redness of into metacarpal region with associated swelling and pain since, difficulty bending, question abscess EXAM: RIGHT HAND - COMPLETE 3+ VIEW COMPARISON:  None FINDINGS: Soft tissue swelling diffusely in RIGHT hand greatest at ring  finger. Osseous mineralization normal. Joint spaces preserved. Old healed fracture fifth metacarpal shaft. No acute fracture, dislocation or bone destruction. No soft tissue gas or radiopaque foreign body identified. IMPRESSION: Diffuse soft tissue swelling. No acute osseous abnormalities. Old healed fifth metacarpal fracture. Electronically Signed   By: Ulyses SouthwardMark  Boles M.D.   On: 11/20/2015 09:10   I have personally reviewed and evaluated these images and lab results as part of my medical decision-making.   EKG Interpretation None      MDM   Final diagnoses:  Abscess of left buttock    I personally performed the services described in this documentation, which was scribed in my presence. The recorded information has been reviewed and is accurate.   Patient with right hand cellulitis. Concern for involvement of the extensor tendon. Redness was marked in the emergency department and will start on IV antibiotics. Will discuss with hand surgery.  Discussed with Dr. Orlan Leavensrtman. Recommending splinting, antibiotics and follow-up in the office tomorrow.  Loren Raceravid Maor Meckel, MD 11/21/15 1122

## 2015-11-22 ENCOUNTER — Emergency Department (HOSPITAL_COMMUNITY)
Admission: EM | Admit: 2015-11-22 | Discharge: 2015-11-22 | Disposition: A | Discharge status: Home / Self Care | Payer: Self-pay | Attending: Emergency Medicine | Admitting: Emergency Medicine

## 2015-11-22 ENCOUNTER — Encounter (HOSPITAL_COMMUNITY): Payer: Self-pay | Admitting: Emergency Medicine

## 2015-11-22 DIAGNOSIS — F17210 Nicotine dependence, cigarettes, uncomplicated: Secondary | ICD-10-CM | POA: Insufficient documentation

## 2015-11-22 DIAGNOSIS — Z791 Long term (current) use of non-steroidal anti-inflammatories (NSAID): Secondary | ICD-10-CM | POA: Insufficient documentation

## 2015-11-22 DIAGNOSIS — F329 Major depressive disorder, single episode, unspecified: Secondary | ICD-10-CM | POA: Insufficient documentation

## 2015-11-22 DIAGNOSIS — Z79899 Other long term (current) drug therapy: Secondary | ICD-10-CM | POA: Insufficient documentation

## 2015-11-22 DIAGNOSIS — L03113 Cellulitis of right upper limb: Secondary | ICD-10-CM | POA: Insufficient documentation

## 2015-11-22 LAB — BASIC METABOLIC PANEL
ANION GAP: 6 (ref 5–15)
BUN: 13 mg/dL (ref 6–20)
CHLORIDE: 103 mmol/L (ref 101–111)
CO2: 28 mmol/L (ref 22–32)
CREATININE: 1 mg/dL (ref 0.61–1.24)
Calcium: 8.6 mg/dL — ABNORMAL LOW (ref 8.9–10.3)
GFR calc non Af Amer: >60 mL/min (ref >60)
Glucose, Bld: 85 mg/dL (ref 65–99)
Potassium: 4.1 mmol/L (ref 3.5–5.1)
SODIUM: 137 mmol/L (ref 135–145)

## 2015-11-22 LAB — CBC WITH DIFFERENTIAL/PLATELET
BASOS ABS: 0 10*3/uL (ref 0.0–0.1)
BASOS PCT: 0 %
EOS PCT: 2 %
Eosinophils Absolute: 0.2 10*3/uL (ref 0.0–0.7)
HEMATOCRIT: 42.1 % (ref 39.0–52.0)
Hemoglobin: 14.3 g/dL (ref 13.0–17.0)
Lymphocytes Relative: 13 %
Lymphs Abs: 1.4 10*3/uL (ref 0.7–4.0)
MCH: 31.4 pg (ref 26.0–34.0)
MCHC: 34 g/dL (ref 30.0–36.0)
MCV: 92.5 fL (ref 78.0–100.0)
MONO ABS: 1.2 10*3/uL — AB (ref 0.1–1.0)
Monocytes Relative: 11 %
NEUTROS ABS: 8 10*3/uL — AB (ref 1.7–7.7)
Neutrophils Relative %: 74 %
Platelets: 156 10*3/uL (ref 150–400)
RBC: 4.55 MIL/uL (ref 4.22–5.81)
RDW: 12.8 % (ref 11.5–15.5)
WBC: 10.7 10*3/uL — AB (ref 4.0–10.5)

## 2015-11-22 MED ORDER — VANCOMYCIN HCL IN DEXTROSE 1-5 GM/200ML-% IV SOLN
1000.0000 mg | Freq: Once | INTRAVENOUS | Status: AC
  Administered 2015-11-22: 1000 mg via INTRAVENOUS
  Filled 2015-11-22: qty 200

## 2015-11-22 MED ORDER — LIDOCAINE HCL (PF) 1 % IJ SOLN
5.0000 mL | Freq: Once | INTRAMUSCULAR | Status: AC
  Administered 2015-11-22: 5 mL
  Filled 2015-11-22: qty 5

## 2015-11-22 MED ORDER — CEPHALEXIN 500 MG PO CAPS: ORAL_CAPSULE | ORAL | Status: DC

## 2015-11-22 MED ORDER — SODIUM CHLORIDE 0.9 % IV BOLUS (SEPSIS)
1000.0000 mL | Freq: Once | INTRAVENOUS | Status: AC
  Administered 2015-11-22: 1000 mL via INTRAVENOUS

## 2015-11-22 NOTE — ED Notes (Signed)
Pt states swelling and redness to right hand is worsening despite abx treatment.  Seen Saturday for same.

## 2015-11-22 NOTE — ED Provider Notes (Signed)
CSN: [id number]     Arrival date & time 11/22/15  [id number] History  By signing my name below, I, Tanda RockersMargaux Venter, attest that this documentation has been prepared under the direction and in the presence of Marily MemosJason Brittiney Dicostanzo, MD. Electronically Signed: Tanda RockersMargaux Venter, ED Scribe. 11/22/2015. 8:17 AM.   Chief Complaint  Patient presents with  . Hand Problem   The history is provided by the patient. No language interpreter was used.    HPI Comments: Nanda QuintonJustin Polak is a 30 y.o. male who is right hand dominant, presents to the Emergency Department complaining of gradual onset, constant, area of swelling and redness to right hand that began 5 days ago, gradually worsening. Pt was seen in the ED on 11/20/2015 (2 days ago) for same symptoms. He had basic blood work done at that time and an x ray with findings of diffuse soft tissue swelling but no other acute findings. He was started on IV antibiotics and given a prescription for Bactrim. Per previous note, pt was supposed to follow up with hand surgeon, Dr. Orlan Leavensrtman the next day. Pt is unaware that he was supposed to follow up. He states that it was a very hectic day in the ED and believes he was not properly told to follow up. Pt presents today for worsening swelling and white purulent drainage to the area. Denies fever, chills, weakness, numbness, tingling, or any other associated symptoms.   Past Medical History  Diagnosis Date  . Depression   . Traumatic brain injury [facility](HCC)    Past Surgical History  Procedure Laterality Date  . Femur fracture surgery     History reviewed. No pertinent family history. Social History  Substance Use Topics  . Smoking status: Current Every Day Smoker -- 0.50 packs/day    Types: Cigarettes  . Smokeless tobacco: Never Used  . Alcohol Use: No     Comment: 5 days a week; former    Review of Systems  Constitutional: Negative for fever and chills.  Musculoskeletal: Positive for joint swelling.  Skin: Positive for color change.   Neurological: Negative for weakness and numbness.  All other systems reviewed and are negative.  Allergies  Review of patient's allergies indicates no known allergies.  Home Medications   Prior to Admission medications   Medication Sig Start Date End Date Taking? Authorizing Provider  ibuprofen (ADVIL,MOTRIN) 800 MG tablet Take 1 tablet (800 mg total) by mouth 3 (three) times daily. 11/20/15  Yes Lonia SkinnerLeslie K Sofia, PA-C  sulfamethoxazole-trimethoprim (BACTRIM DS,SEPTRA DS) 800-160 MG tablet Take 1 tablet by mouth 2 (two) times daily. 11/20/15 11/27/15 Yes Lonia SkinnerLeslie K Sofia, PA-C  cephALEXin (KEFLEX) 500 MG capsule 2 caps po bid x 10 days 11/22/15   Marily MemosJason Swan Zayed, MD   BP 107/71 mmHg  Pulse 78  Temp(Src) 98 F (36.7 C) (Oral)  Resp 19  Ht 5\' 8"  (1.727 m)  Wt 215 lb (97.523 kg)  BMI 32.70 kg/m2  SpO2 100%   Physical Exam  Constitutional: He is oriented to person, place, and time. He appears well-developed and well-nourished. No distress.  HENT:  Head: Normocephalic and atraumatic.  Eyes: Conjunctivae and EOM are normal.  Neck: Neck supple. No tracheal deviation present.  Cardiovascular: Normal rate.   Pulmonary/Chest: Effort normal. No respiratory distress.  Musculoskeletal: Normal range of motion. He exhibits edema and tenderness.  Fluctuant tender red area to right 4th digit. Extensive soft tissue swelling over entire right dorsum of hand down to wrist. Erythema to whole area. Mild tenderness with  extension of wrist. No tenderness with flexion of wrist. No tenderness with flexion of fingers.   Neurological: He is alert and oriented to person, place, and time.  Skin: Skin is warm and dry.  Psychiatric: He has a normal mood and affect. His behavior is normal.  Nursing note and vitals reviewed.   ED Course  .Marland Kitchen.Incision and Drainage Date/Time: 11/23/2015 2:27 PM Performed by: Marily MemosMESNER, Moorea Boissonneault Authorized by: Marily MemosMESNER, Zanasia Hickson Consent: Verbal consent obtained. Risks and benefits: risks, benefits  and alternatives were discussed Consent given by: patient Type: abscess Body area: upper extremity Location details: right ring finger Anesthesia: local infiltration Local anesthetic: lidocaine 1% without epinephrine Anesthetic total: 2 ml Scalpel size: 11 Incision type: single straight Complexity: simple Drainage: purulent Drainage amount: scant Wound treatment: wound left open Patient tolerance: Patient tolerated the procedure well with no immediate complications   (including critical care time)  DIAGNOSTIC STUDIES: Oxygen Saturation is 100% on RA, normal by my interpretation.    COORDINATION OF CARE: 8:16 AM-Discussed treatment plan which includes consult with Dr. Orlan Leavensrtman with pt at bedside and pt agreed to plan.   Labs Review Labs Reviewed  CBC WITH DIFFERENTIAL/PLATELET - Abnormal; Notable for the following:    WBC 10.7 (*)    Neutro Abs 8.0 (*)    Monocytes Absolute 1.2 (*)    All other components within normal limits  BASIC METABOLIC PANEL - Abnormal; Notable for the following:    Calcium 8.6 (*)    All other components within normal limits    Imaging Review No results found. I have personally reviewed and evaluated these images and lab results as part of my medical decision-making.   EKG Interpretation None      MDM   Final diagnoses:  Cellulitis of right upper extremity   30 yo M w/ cellulitis and abscess for the last couple days, not improving with bactrim. Was supposed to see Dr. Orlan Leavensrtman yesterday but didn't know it. I started on Keflex to go along with bactrim, and discussed with Dr. Bari Edwardrtman's office who will call patient to get set up with an appointment. Will return here if not improving in a couple days and not able to see hand surgery.   New Prescriptions: Discharge Medication List as of 11/22/2015 10:24 AM    START taking these medications   Details  cephALEXin (KEFLEX) 500 MG capsule 2 caps po bid x 10 days, Print         I have personally  and contemperaneously reviewed labs and imaging and used in my decision making as above.   A medical screening exam was performed and I feel the patient has had an appropriate workup for their chief complaint at this time and likelihood of emergent condition existing is low and thus workup can continue on an outpatient basis.. Their vital signs are stable. They have been counseled on decision, discharge, follow up and which symptoms necessitate immediate return to the emergency department.  They verbally stated understanding and agreement with plan and discharged in stable condition.     I personally performed the services described in this documentation, which was scribed in my presence. The recorded information has been reviewed and is accurate.        Marily MemosJason Bowdy Bair, MD 11/23/15 1430

## 2015-11-22 NOTE — ED Notes (Addendum)
Pt states he was started on antibiotic on Sunday. Pt c/o worsening pain to right hand. Erythema/edema noted to right ring finger extending into posterior hand.

## 2016-03-28 ENCOUNTER — Encounter: Payer: Self-pay | Admitting: Physician Assistant

## 2016-03-28 ENCOUNTER — Ambulatory Visit: Payer: Self-pay | Admitting: Physician Assistant

## 2016-03-28 VITALS — BP 108/66 | HR 100 | Temp 99.1°F | Ht 67.5 in | Wt 222.5 lb

## 2016-03-28 DIAGNOSIS — Z131 Encounter for screening for diabetes mellitus: Secondary | ICD-10-CM

## 2016-03-28 DIAGNOSIS — F17210 Nicotine dependence, cigarettes, uncomplicated: Secondary | ICD-10-CM

## 2016-03-28 DIAGNOSIS — Z9189 Other specified personal risk factors, not elsewhere classified: Secondary | ICD-10-CM

## 2016-03-28 DIAGNOSIS — Z1159 Encounter for screening for other viral diseases: Secondary | ICD-10-CM

## 2016-03-28 DIAGNOSIS — Z13220 Encounter for screening for lipoid disorders: Secondary | ICD-10-CM

## 2016-03-28 DIAGNOSIS — F1911 Other psychoactive substance abuse, in remission: Secondary | ICD-10-CM

## 2016-03-28 DIAGNOSIS — R351 Nocturia: Secondary | ICD-10-CM

## 2016-03-28 DIAGNOSIS — R748 Abnormal levels of other serum enzymes: Secondary | ICD-10-CM

## 2016-03-28 LAB — POCT URINALYSIS DIPSTICK
Bilirubin, UA: NEGATIVE
Glucose, UA: NEGATIVE
Leukocytes, UA: NEGATIVE
Nitrite, UA: NEGATIVE
PH UA: 6.5
PROTEIN UA: 30
RBC UA: NEGATIVE
SPEC GRAV UA: 1.02
UROBILINOGEN UA: 1

## 2016-03-28 LAB — GLUCOSE, POCT (MANUAL RESULT ENTRY): POC Glucose: 109 mg/dl — AB (ref 70–99)

## 2016-03-28 NOTE — Progress Notes (Signed)
BP 108/66 (BP Location: Left Arm, Patient Position: Sitting, Cuff Size: Normal)   Pulse 100   Temp 99.1 F (37.3 C)   Ht 5' 7.5" (1.715 m)   Wt 222 lb 8 oz (100.9 kg)   SpO2 97%   BMI 34.33 kg/m    Subjective:    Patient ID: Joseph Hines, male    DOB: 11/17/1985, 30 y.o.   MRN: [id number]  HPI: Joseph QuintonJustin Harnois is a 30 y.o. male presenting on 03/28/2016 for New Patient (Initial Visit) (pt states he has not had pcp since he was a child. pt has no complaints is here for labwork and check up. pt wants to be checked for DM and hepatitis due to his previous drug use.)   HPI   Chief Complaint  Patient presents with  . New Patient (Initial Visit)    pt states he has not had pcp since he was a child. pt has no complaints is here for labwork and check up. pt wants to be checked for DM and hepatitis due to his previous drug use.     Relevant past medical, surgical, family and social history reviewed and updated as indicated. Interim medical history since our last visit reviewed. Allergies and medications reviewed and updated.  No current outpatient prescriptions on file.   Review of Systems  Constitutional: Negative for appetite change, chills, diaphoresis, fatigue, fever and unexpected weight change.  HENT: Negative for congestion, dental problem, drooling, ear pain, facial swelling, hearing loss, mouth sores, sneezing, sore throat, trouble swallowing and voice change.   Eyes: Negative for pain, discharge, redness, itching and visual disturbance.  Respiratory: Negative for cough, choking, shortness of breath and wheezing.   Cardiovascular: Negative for chest pain, palpitations and leg swelling.  Gastrointestinal: Negative for abdominal pain, blood in stool, constipation, diarrhea and vomiting.  Endocrine: Negative for cold intolerance, heat intolerance and polydipsia.  Genitourinary: Negative for decreased urine volume, dysuria and hematuria.  Musculoskeletal: Negative for arthralgias,  back pain and gait problem.  Skin: Negative for rash.  Allergic/Immunologic: Negative for environmental allergies.  Neurological: Negative for seizures, syncope, light-headedness and headaches.  Hematological: Negative for adenopathy.  Psychiatric/Behavioral: Negative for agitation, dysphoric mood and suicidal ideas. The patient is not nervous/anxious.     Per HPI unless specifically indicated above     Objective:    BP 108/66 (BP Location: Left Arm, Patient Position: Sitting, Cuff Size: Normal)   Pulse 100   Temp 99.1 F (37.3 C)   Ht 5' 7.5" (1.715 m)   Wt 222 lb 8 oz (100.9 kg)   SpO2 97%   BMI 34.33 kg/m   Wt Readings from Last 3 Encounters:  03/28/16 222 lb 8 oz (100.9 kg)  11/22/15 215 lb (97.5 kg)  11/20/15 215 lb (97.5 kg)    Physical Exam  Constitutional: He is oriented to person, place, and time. He appears well-developed and well-nourished.  HENT:  Head: Normocephalic and atraumatic.  Mouth/Throat: Oropharynx is clear and moist. No oropharyngeal exudate.  Eyes: Conjunctivae and EOM are normal. Pupils are equal, round, and reactive to light.  Neck: Neck supple. No thyromegaly present.  Cardiovascular: Normal rate and regular rhythm.   Pulmonary/Chest: Effort normal and breath sounds normal. He has no wheezes. He has no rales.  Abdominal: Soft. Bowel sounds are normal. He exhibits no mass. There is no hepatosplenomegaly. There is no tenderness.  Musculoskeletal: He exhibits no edema.       Thoracic back: He exhibits spasm.  Lymphadenopathy:  He has no cervical adenopathy.  Neurological: He is alert and oriented to person, place, and time.  Skin: Skin is warm and dry. No rash noted.  Psychiatric: He has a normal mood and affect. His behavior is normal. Thought content normal.  Vitals reviewed.   Results for orders placed or performed in visit on 03/28/16  POCT Glucose (CBG)  Result Value Ref Range   POC Glucose 109 (A) 70 - 99 mg/dl      Assessment &  Plan:    Encounter Diagnoses  Name Primary?  . 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 Nocturia   . Cigarette nicotine dependence without complication   . Elevated liver enzymes   . Screening for diabetes mellitus (DM)   . Encounter for hepatitis C virus screening test for high risk patient   . Screening cholesterol level     -check baseline labs -pt counseled on smoking cessation -follow up within the month to review labs

## 2016-03-29 LAB — COMPREHENSIVE METABOLIC PANEL
ALK PHOS: 37 U/L — AB (ref 40–115)
ALT: 50 U/L — AB (ref 9–46)
AST: 34 U/L (ref 10–40)
Albumin: 4.3 g/dL (ref 3.6–5.1)
BILIRUBIN TOTAL: 0.4 mg/dL (ref 0.2–1.2)
BUN: 16 mg/dL (ref 7–25)
CALCIUM: 9 mg/dL (ref 8.6–10.3)
CO2: 28 mmol/L (ref 20–31)
Chloride: 104 mmol/L (ref 98–110)
Creat: 1.17 mg/dL (ref 0.60–1.35)
GLUCOSE: 93 mg/dL (ref 65–99)
POTASSIUM: 4.3 mmol/L (ref 3.5–5.3)
Sodium: 142 mmol/L (ref 135–146)
TOTAL PROTEIN: 6.6 g/dL (ref 6.1–8.1)

## 2016-03-29 LAB — LIPID PANEL
CHOL/HDL RATIO: 5 ratio (ref <=5.0)
CHOLESTEROL: 155 mg/dL (ref 125–200)
HDL: 31 mg/dL — AB (ref >=40)
LDL Cholesterol: 94 mg/dL (ref <130)
TRIGLYCERIDES: 150 mg/dL — AB (ref <150)
VLDL: 30 mg/dL (ref <30)

## 2016-03-29 LAB — HEMOGLOBIN A1C
HEMOGLOBIN A1C: 5.2 % (ref <5.7)
Mean Plasma Glucose: 103 mg/dL

## 2016-03-30 LAB — HEPATITIS PANEL, ACUTE
HCV Ab: REACTIVE — AB
HEP A IGM: NONREACTIVE
HEP B C IGM: NONREACTIVE
HEP B S AG: NEGATIVE

## 2016-04-03 LAB — HEPATITIS C RNA QUANTITATIVE
HCV Quantitative Log: 7.24 {Log} — ABNORMAL HIGH (ref <1.18)
HCV Quantitative: 17264284 IU/mL — ABNORMAL HIGH (ref <15)

## 2016-04-04 ENCOUNTER — Ambulatory Visit: Payer: Self-pay | Admitting: Physician Assistant

## 2016-04-04 ENCOUNTER — Encounter: Payer: Self-pay | Admitting: Physician Assistant

## 2016-04-04 VITALS — BP 112/80 | HR 100 | Temp 98.1°F | Ht 67.5 in | Wt 227.8 lb

## 2016-04-04 DIAGNOSIS — F17210 Nicotine dependence, cigarettes, uncomplicated: Secondary | ICD-10-CM

## 2016-04-04 DIAGNOSIS — B1920 Unspecified viral hepatitis C without hepatic coma: Secondary | ICD-10-CM | POA: Insufficient documentation

## 2016-04-04 DIAGNOSIS — F1911 Other psychoactive substance abuse, in remission: Secondary | ICD-10-CM

## 2016-04-04 NOTE — Patient Instructions (Signed)
Hepatitis C  Hepatitis C is a viral infection of the liver. It can lead to scarring of the liver (cirrhosis), liver failure, or liver cancer. Hepatitis C may go undetected for months or years because people with the infection may not have symptoms, or they may have only mild symptoms.  CAUSES   Hepatitis C is caused by the hepatitis C virus (HCV). The virus can be passed from one person to another through:   Blood.   Contaminated needles, such as those used for tattooing, body piercing, acupuncture, or injecting drugs.   Having unprotected sex with an infected person.   Childbirth.   Blood transfusions or organ transplants done in the [place] before 1992.  RISK FACTORS  Risk factors for hepatitis C include:   Having unprotected sex with an infected person.   Using illegal drugs.  SIGNS AND SYMPTOMS   Symptoms of hepatitis C may include:   Fatigue.   Loss of appetite.   Nausea.   Vomiting.   Abdominal pain.   Dark yellow urine.   Yellowish skin and eyes (jaundice).   Itching of the skin.   Clay-colored bowel movements.   Joint pain.  Symptoms are not always present.   DIAGNOSIS   Hepatitis C is diagnosed with blood tests. Other types of tests may also be done to check how your liver is functioning.  TREATMENT   Your health care provider may perform noninvasive tests or a liver biopsy to help determine the best course of treatment. Treatment for hepatitis C may include one or more medicines. Your health care provider may check you for a recurring infection or other liver conditions every 6-12 months after treatment.  HOME CARE INSTRUCTIONS    Rest as needed.   Take all medicines as directed by your health care provider.   Do not take any medicine unless approved by your health care provider. This includes over-the-counter medicine and birth control pills.   Do not drink alcohol.   Do not have sex until approved by your health care provider.   Do not share toothbrushes, nail clippers,  razors, or needles.  PREVENTION  There is no vaccine for hepatitis C. The only way to prevent the disease is to reduce the risk of exposure to the virus. This may be done by:   Practicing safe sex and using condoms.   Avoiding illegal drugs.  SEEK MEDICAL CARE IF:   You have a fever.   You develop abdominal pain.   You develop dark urine.   You have clay-colored bowel movements.   You develop joint pains.  SEEK IMMEDIATE MEDICAL CARE IF:   You have increasing fatigue or weakness.   You lose your appetite.   You feel nauseous or vomit.   You develop jaundice or your jaundice gets worse.   You bruise or bleed easily.  MAKE SURE YOU:    Understand these instructions.   Will watch your condition.   Will get help right away if you are not doing well or get worse.     This information is not intended to replace advice given to you by your health care provider. Make sure you discuss any questions you have with your health care provider.     Document Released: 05/11/2000 Document Revised: 06/04/2014 Document Reviewed: 08/26/2013  Elsevier Interactive Patient Education 2016 [employer]

## 2016-04-04 NOTE — Progress Notes (Signed)
BP 112/80 (BP Location: Left Arm, Patient Position: Sitting, Cuff Size: Large)   Pulse 100   Temp 98.1 F (36.7 C)   Ht 5' 7.5" (1.715 m)   Wt 227 lb 12.8 oz (103.3 kg)   SpO2 98%   BMI 35.15 kg/m    Subjective:    Patient ID: Joseph QuintonJustin Hines, male    DOB: 06/09/1985, 30 y.o.   MRN: [id number]  HPI: Joseph Hines is a 30 y.o. male presenting on 04/04/2016 for Follow-up (to review labs)   HPI  Relevant past medical, surgical, family and social history reviewed and updated as indicated. Interim medical history since our last visit reviewed. Allergies and medications reviewed and updated.  Review of Systems  Constitutional: Negative for appetite change, chills, diaphoresis, fatigue, fever and unexpected weight change.  HENT: Negative for congestion, dental problem, drooling, ear pain, facial swelling, hearing loss, mouth sores, sneezing, sore throat, trouble swallowing and voice change.   Eyes: Negative for pain, discharge, redness, itching and visual disturbance.  Respiratory: Negative for cough, choking, shortness of breath and wheezing.   Cardiovascular: Negative for chest pain, palpitations and leg swelling.  Gastrointestinal: Negative for abdominal pain, blood in stool, constipation, diarrhea and vomiting.  Endocrine: Negative for cold intolerance, heat intolerance and polydipsia.  Genitourinary: Negative for decreased urine volume, dysuria and hematuria.  Musculoskeletal: Negative for arthralgias, back pain and gait problem.  Skin: Negative for rash.  Allergic/Immunologic: Negative for environmental allergies.  Neurological: Negative for seizures, syncope, light-headedness and headaches.  Hematological: Negative for adenopathy.  Psychiatric/Behavioral: Negative for agitation, dysphoric mood and suicidal ideas. The patient is not nervous/anxious.     Per HPI unless specifically indicated above     Objective:    BP 112/80 (BP Location: Left Arm, Patient Position: Sitting,  Cuff Size: Large)   Pulse 100   Temp 98.1 F (36.7 C)   Ht 5' 7.5" (1.715 m)   Wt 227 lb 12.8 oz (103.3 kg)   SpO2 98%   BMI 35.15 kg/m   Wt Readings from Last 3 Encounters:  04/04/16 227 lb 12.8 oz (103.3 kg)  03/28/16 222 lb 8 oz (100.9 kg)  11/22/15 215 lb (97.5 kg)    Physical Exam  Constitutional: He is oriented to person, place, and time. He appears well-developed and well-nourished.  HENT:  Head: Normocephalic and atraumatic.  Pulmonary/Chest: Effort normal.  Neurological: He is alert and oriented to person, place, and time. Gait normal.  Skin: Skin is warm and dry.  Psychiatric: He has a normal mood and affect. His behavior is normal. Thought content normal.  Nursing note and vitals reviewed.   Results for orders placed or performed in visit on 03/28/16  Lipid Profile  Result Value Ref Range   Cholesterol 155 125 - 200 mg/dL   Triglycerides 147150 (H) <150 mg/dL   HDL 31 (L) >=82>=40 mg/dL   Total CHOL/HDL Ratio 5.0 <=5.0 Ratio   VLDL 30 <30 mg/dL   LDL Cholesterol 94 <956<130 mg/dL  HgB O1HA1c  Result Value Ref Range   Hgb A1c MFr Bld 5.2 <5.7 %   Mean Plasma Glucose 103 mg/dL  Comprehensive Metabolic Panel (CMET)  Result Value Ref Range   Sodium 142 135 - 146 mmol/L   Potassium 4.3 3.5 - 5.3 mmol/L   Chloride 104 98 - 110 mmol/L   CO2 28 20 - 31 mmol/L   Glucose, Bld 93 65 - 99 mg/dL   BUN 16 7 - 25 mg/dL   Creat 0.861.17  0.60 - 1.35 mg/dL   Total Bilirubin 0.4 0.2 - 1.2 mg/dL   Alkaline Phosphatase 37 (L) 40 - 115 U/L   AST 34 10 - 40 U/L   ALT 50 (H) 9 - 46 U/L   Total Protein 6.6 6.1 - 8.1 g/dL   Albumin 4.3 3.6 - 5.1 g/dL   Calcium 9.0 8.6 - 16.110.3 mg/dL  Hepatitis, Acute  Result Value Ref Range   Hepatitis B Surface Ag NEGATIVE NEGATIVE   HCV Ab REACTIVE (A) NEGATIVE   Hep B C IgM NON REACTIVE NON REACTIVE   Hep A IgM NON REACTIVE NON REACTIVE  Hepatitis C RNA quantitative  Result Value Ref Range   HCV Quantitative 09,604,54017,264,284 (H) <15 IU/mL   HCV Quantitative  Log 7.24 (H) <1.18 log 10  POCT Glucose (CBG)  Result Value Ref Range   POC Glucose 109 (A) 70 - 99 mg/dl  POCT Urinalysis Dipstick  Result Value Ref Range   Color, UA DARK YELLOW    Clarity, UA CLOUDY    Glucose, UA NEG    Bilirubin, UA NEG    Ketones, UA TRACE    Spec Grav, UA 1.020    Blood, UA NEG    pH, UA 6.5    Protein, UA 30    Urobilinogen, UA 1.0    Nitrite, UA NEG    Leukocytes, UA Negative Negative      Assessment & Plan:   Encounter Diagnoses  Name Primary?  . Hepatitis C virus infection without hepatic coma, unspecified chronicity Yes  . Substance abuse in remission   . Cigarette nicotine dependence without complication     -reviewed labs with pt -Gave [facility] discount application -Refer to GI for hepatitis treatment -pt was given reading information on Hepatitis C -follow up one month

## 2016-04-09 ENCOUNTER — Encounter: Payer: Self-pay | Admitting: Internal Medicine

## 2016-04-24 ENCOUNTER — Ambulatory Visit (INDEPENDENT_AMBULATORY_CARE_PROVIDER_SITE_OTHER): Payer: Self-pay | Admitting: Gastroenterology

## 2016-04-24 ENCOUNTER — Other Ambulatory Visit: Payer: Self-pay | Admitting: Physician Assistant

## 2016-04-24 ENCOUNTER — Encounter: Payer: Self-pay | Admitting: Gastroenterology

## 2016-04-24 VITALS — BP 130/76 | HR 94 | Temp 98.3°F | Ht 68.0 in | Wt 224.4 lb

## 2016-04-24 DIAGNOSIS — B182 Chronic viral hepatitis C: Secondary | ICD-10-CM
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
## 2016-04-24 NOTE — Assessment & Plan Note (Signed)
30 year old gentleman with chronic hepatitis C confirmed by HCV RNA testing. Viral count greater than 17 million. Exposure likely due to IV drug use. Patient has been clean for 7 months from alcohol and drugs. He is interested in treatment. We discussed modes of transmission, natural history of hepatitis C. Advised to not share razor blades, toothbrushes, nail clippers. He has already advised his housemates that he has hepatitis C for their safety. We discussed hepatitis C treatment. We will need further labs, abdominal ultrasound and then we will fill out patient assistance forms for treatment. Patient was advised that he would need to remain compliant with medications, call us if he starts any new medications either Rx or over-the-counter because of potential interactions with hepatitis C medications. He was advised to continue avoid illicit drugs and alcohol and that he could potentially be reinfected with hepatitis C if he engaged in risky behaviors. He voiced understanding. Further recommendations to follow.

## 2016-04-24 NOTE — Progress Notes (Signed)
CC'D TO PCP °

## 2016-04-24 NOTE — Patient Instructions (Signed)
1. Please have your labs done. We will contact you with results as available. 2. Please have your ultrasound done. 3. Finish completing [place] assistance forms and return as soon as possible. 4. Read over the patient information provided by Harvoni.

## 2016-05-01 ENCOUNTER — Ambulatory Visit (HOSPITAL_COMMUNITY)
Admission: RE | Admit: 2016-05-01 | Discharge: 2016-05-01 | Disposition: A | Discharge status: Home / Self Care | Payer: Self-pay | Source: Ambulatory Visit | Attending: Gastroenterology | Admitting: Gastroenterology

## 2016-05-01 DIAGNOSIS — N289 Disorder of kidney and ureter, unspecified: Secondary | ICD-10-CM | POA: Insufficient documentation

## 2016-05-01 DIAGNOSIS — B182 Chronic viral hepatitis C: Secondary | ICD-10-CM

## 2016-05-01 LAB — PROTIME-INR
INR: 1
Prothrombin Time: 10.6 s (ref 9.0–11.5)

## 2016-05-02 LAB — HEPATITIS A ANTIBODY, TOTAL: Hep A Total Ab: NONREACTIVE

## 2016-05-02 LAB — HIV ANTIBODY (ROUTINE TESTING W REFLEX): HIV: NONREACTIVE

## 2016-05-02 LAB — HEPATITIS B SURFACE ANTIBODY, QUANTITATIVE: HEPATITIS B-POST: 256 m[IU]/mL

## 2016-05-02 LAB — HEPATITIS B CORE ANTIBODY, TOTAL: HEP B C TOTAL AB: NONREACTIVE

## 2016-05-03 ENCOUNTER — Ambulatory Visit: Payer: Self-pay | Admitting: Physician Assistant

## 2016-05-03 ENCOUNTER — Encounter: Payer: Self-pay | Admitting: Physician Assistant

## 2016-05-03 VITALS — BP 110/78 | HR 99 | Temp 97.3°F | Ht 68.0 in | Wt 224.6 lb

## 2016-05-03 DIAGNOSIS — F1911 Other psychoactive substance abuse, in remission: Secondary | ICD-10-CM

## 2016-05-03 DIAGNOSIS — B1920 Unspecified viral hepatitis C without hepatic coma: Secondary | ICD-10-CM

## 2016-05-03 DIAGNOSIS — F17210 Nicotine dependence, cigarettes, uncomplicated: Secondary | ICD-10-CM

## 2016-05-03 NOTE — Progress Notes (Signed)
   BP 110/78 (BP Location: Left Arm, Patient Position: Sitting, Cuff Size: Large)   Pulse 99   Temp 97.3 F (36.3 C)   Ht 5\' 8"  (1.727 m)   Wt 224 lb 9.6 oz (101.9 kg)   SpO2 98%   BMI 34.15 kg/m    Subjective:    Patient ID: Joseph Hines, male    DOB: 12/28/1985, 30 y.o.   MRN: [id number]  HPI: Joseph Hines is a 30 y.o. male presenting on 05/03/2016 for Follow-up   HPI   [facility]  He says he is doing well.  He went to GI and they ordered labs and US.  Pt has turned in his [facility] application but he   Relevant past medical, surgical, family and social history reviewed and updated as indicated. Interim medical history since our last visit reviewed. Allergies and medications reviewed and updated.  No current outpatient prescriptions on file.   Review of Systems  Constitutional: Negative for appetite change, chills, diaphoresis, fatigue, fever and unexpected weight change.  HENT: Negative for congestion, dental problem, drooling, ear pain, facial swelling, hearing loss, mouth sores, sneezing, sore throat, trouble swallowing and voice change.   Eyes: Negative for pain, discharge, redness, itching and visual disturbance.  Respiratory: Negative for cough, choking, shortness of breath and wheezing.   Cardiovascular: Negative for chest pain, palpitations and leg swelling.  Gastrointestinal: Negative for abdominal pain, blood in stool, constipation, diarrhea and vomiting.  Endocrine: Negative for cold intolerance, heat intolerance and polydipsia.  Genitourinary: Negative for decreased urine volume, dysuria and hematuria.  Musculoskeletal: Negative for arthralgias, back pain and gait problem.  Skin: Negative for rash.  Allergic/Immunologic: Negative for environmental allergies.  Neurological: Negative for seizures, syncope, light-headedness and headaches.  Hematological: Negative for adenopathy.  Psychiatric/Behavioral: Negative for agitation, dysphoric  mood and suicidal ideas. The patient is not nervous/anxious.     Per HPI unless specifically indicated above     Objective:    BP 110/78 (BP Location: Left Arm, Patient Position: Sitting, Cuff Size: Large)   Pulse 99   Temp 97.3 F (36.3 C)   Ht 5\' 8"  (1.727 m)   Wt 224 lb 9.6 oz (101.9 kg)   SpO2 98%   BMI 34.15 kg/m   Wt Readings from Last 3 Encounters:  05/03/16 224 lb 9.6 oz (101.9 kg)  04/24/16 224 lb 6.4 oz (101.8 kg)  04/04/16 227 lb 12.8 oz (103.3 kg)    Physical Exam  Constitutional: He is oriented to person, place, and time. He appears well-developed and well-nourished.  HENT:  Head: Normocephalic and atraumatic.  Pulmonary/Chest: Effort normal.  Neurological: He is alert and oriented to person, place, and time.  Skin: Skin is warm and dry.  Psychiatric: He has a normal mood and affect. His behavior is normal.  Nursing note and vitals reviewed.       Assessment & Plan:   Encounter Diagnoses  Name Primary?  . Hepatitis C virus infection without hepatic coma, unspecified chronicity Yes  . Substance abuse in remission   . Cigarette nicotine dependence without complication     Discussed kidney cyst seen on US.  Will discuss at next OV after pt has [facility] whether he would rather get MRI or repeat US in 6 months.    Pt to continue with GI for hepatitis treatment  F/u 6 wk.  RTO sooner prn

## 2016-05-04 LAB — HEPATITIS C GENOTYPE

## 2016-05-16 NOTE — Progress Notes (Signed)
Please let patient know his u/s shows 1.1cm left mid kidny lesion, ?complex cyst but radiology has recommended MRI abdomen with renal protocol.  No evidence of cirrhosis. F0/F1 score.  See lab result note as well.   Please schedule MRI abd with renal protocol for left mid kidney lesion seen on u/s.

## 2016-05-16 NOTE — Progress Notes (Signed)
HCV genotype 2b.  Immune to hep B. Negative for HIV.  No immunity to Hep A.   See result note for u/s.  Let patient know that we will start approval process for HCV treatment likely [facility].  MRI of renal lesion as outlined in result note for u/s.

## 2016-05-17 ENCOUNTER — Other Ambulatory Visit: Payer: Self-pay

## 2016-05-17 DIAGNOSIS — N289 Disorder of kidney and ureter, unspecified: Secondary | ICD-10-CM

## 2016-05-19 ENCOUNTER — Emergency Department (HOSPITAL_COMMUNITY)
Admission: EM | Admit: 2016-05-19 | Discharge: 2016-05-19 | Disposition: A | Discharge status: Home / Self Care | Payer: Self-pay | Attending: Emergency Medicine | Admitting: Emergency Medicine

## 2016-05-19 ENCOUNTER — Encounter (HOSPITAL_COMMUNITY): Payer: Self-pay | Admitting: Emergency Medicine

## 2016-05-19 DIAGNOSIS — K029 Dental caries, unspecified: Secondary | ICD-10-CM | POA: Insufficient documentation

## 2016-05-19 DIAGNOSIS — K0889 Other specified disorders of teeth and supporting structures: Secondary | ICD-10-CM

## 2016-05-19 DIAGNOSIS — F17210 Nicotine dependence, cigarettes, uncomplicated: Secondary | ICD-10-CM | POA: Insufficient documentation

## 2016-05-19 MED ORDER — TRAMADOL HCL 50 MG PO TABS: 50.0000 mg | ORAL_TABLET | Freq: Four times a day (QID) | ORAL | 0 refills | Status: DC | PRN

## 2016-05-19 MED ORDER — CLINDAMYCIN HCL 150 MG PO CAPS: 300.0000 mg | ORAL_CAPSULE | Freq: Four times a day (QID) | ORAL | 0 refills | Status: DC

## 2016-05-19 NOTE — ED Triage Notes (Signed)
Pt reports he has been having dental pain over 1 week. Tooth is broken on L side "for a while now", has not seen dentist.

## 2016-05-19 NOTE — Discharge Instructions (Signed)
Call one of the dentists on the list provided to arrange a follow-up appt.   °

## 2016-05-19 NOTE — ED Provider Notes (Signed)
[facility]-EMER[facility]ENCY DEPT Provider Note   CSN: [id number] Arrival date & time: 05/19/16  1540     History   Chief Complaint Chief Complaint  Patient presents with  . Dental Pain    HPI Joseph QuintonJustin Agresti is a 30 y.o. male.  HPI  Joseph Hines is a 30 y.o. male who presents to the Emergency Department complaining of dental pain for one week.  He states that he has a tooth that has broken off and he has been experiencing intermittent sharp stabbing pains to his left face that radiates to his ear.  Pain is worse with chewing.  He denies fever, neck pain, difficulty swallowing or facial swelling.    Past Medical History:  Diagnosis Date  . Traumatic brain injury [facility](HCC) age 30    Patient Active Problem List   Diagnosis Date Noted  . Hepatitis C virus infection without hepatic coma 04/04/2016  . Alcohol use disorder, severe, dependence (HCC) 09/12/2015  . Opioid use disorder, moderate, dependence (HCC) 09/12/2015  . Tobacco use disorder 09/12/2015  . 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 Cocaine use disorder, severe, dependence (HCC) 09/11/2015  . Severe benzodiazepine use disorder (HCC) 09/11/2015  . Amphetamine use disorder, severe, dependence (HCC) 09/11/2015    Past Surgical History:  Procedure Laterality Date  . FEMUR FRACTURE SURGERY Right age 30  . LEG SURGERY     right   . TONSILLECTOMY         Home Medications    Prior to Admission medications   Medication Sig Start Date End Date Taking? Authorizing Provider  clindamycin (CLEOCIN) 150 MG capsule Take 2 capsules (300 mg total) by mouth 4 (four) times daily. For 7 days 05/19/16   Cartez Mogle, PA-C  traMADol (ULTRAM) 50 MG tablet Take 1 tablet (50 mg total) by mouth every 6 (six) hours as needed. 05/19/16   Ashritha Desrosiers, PA-C    Family History Family History  Problem Relation Age of Onset  . Breast cancer Mother   . Liver disease Neg Hx     Social  History Social History  Substance Use Topics  . Smoking status: Current Every Day Smoker    Packs/day: 1.00    Years: 13.00    Types: Cigarettes  . Smokeless tobacco: Never Used  . Alcohol use No     Comment: hx alcoholism. sober since 09-08-15     Allergies   Patient has no known allergies.   Review of Systems Review of Systems  Constitutional: Negative for appetite change and fever.  HENT: Positive for dental problem. Negative for congestion, facial swelling, sore throat and trouble swallowing.   Eyes: Negative for pain and visual disturbance.  Musculoskeletal: Negative for neck pain and neck stiffness.  Neurological: Negative for dizziness, facial asymmetry and headaches.  Hematological: Negative for adenopathy.  All other systems reviewed and are negative.    Physical Exam Updated Vital Signs BP 145/81 (BP Location: Left Arm)   Pulse 90   Temp 98 F (36.7 C) (Oral)   Resp 18   Ht 5\' 8"  (1.727 m)   Wt 99.8 kg   SpO2 100%   BMI 33.45 kg/m   Physical Exam  Constitutional: He is oriented to person, place, and time. He appears well-developed and well-nourished. No distress.  HENT:  Head: Normocephalic and atraumatic.  Right Ear: Tympanic membrane and ear canal normal.  Left Ear: Tympanic membrane and ear canal normal.  Mouth/Throat: Uvula is midline, oropharynx is clear  and moist and mucous membranes are normal. No trismus in the jaw. Dental caries present. No dental abscesses or uvula swelling.  Tenderness and dental decay of the left lower first molar.  No facial swelling, obvious dental abscess, trismus, or sublingual abnml.    Neck: Normal range of motion. Neck supple.  Cardiovascular: Normal rate, regular rhythm and normal heart sounds.   No murmur heard. Pulmonary/Chest: Effort normal and breath sounds normal.  Musculoskeletal: Normal range of motion.  Lymphadenopathy:    He has no cervical adenopathy.  Neurological: He is alert and oriented to person,  place, and time. He exhibits normal muscle tone. Coordination normal.  Skin: Skin is warm and dry.  Nursing note and vitals reviewed.    ED Treatments / Results  Labs (all labs ordered are listed, but only abnormal results are displayed) Labs Reviewed - No data to display  EKG  EKG Interpretation None       Radiology No results found.  Procedures Procedures (including critical care time)  Medications Ordered in ED Medications - No data to display   Initial Impression / Assessment and Plan / ED Course  I have reviewed the triage vital signs and the nursing notes.  Pertinent labs & imaging results that were available during my care of the patient were reviewed by me and considered in my medical decision making (see chart for details).  Clinical Course     Pt well appearing, airway patent.  No dental abscess or clinical concern for Ludwig's angina.  Dentist referral list given.    Final Clinical Impressions(s) / ED Diagnoses   Final diagnoses:  Pain, dental    New Prescriptions New Prescriptions   CLINDAMYCIN (CLEOCIN) 150 MG CAPSULE    Take 2 capsules (300 mg total) by mouth 4 (four) times daily. For 7 days   TRAMADOL (ULTRAM) 50 MG TABLET    Take 1 tablet (50 mg total) by mouth every 6 (six) hours as needed.     Pauline Ausammy Tiffney Haughton, PA-C 05/19/16 1640    Maia PlanJoshua G Long, MD 05/19/16 [phone]

## 2016-05-23 ENCOUNTER — Ambulatory Visit (HOSPITAL_COMMUNITY): Admission: RE | Admit: 2016-05-23 | Payer: Self-pay | Source: Ambulatory Visit

## 2016-06-13 NOTE — Progress Notes (Signed)
Patient cancelled his MRI. Can we follow up on this with him. Needs to be addressed before starting HCV treatment.

## 2016-06-14 ENCOUNTER — Ambulatory Visit: Payer: Self-pay | Admitting: Physician Assistant

## 2016-06-20 ENCOUNTER — Other Ambulatory Visit: Payer: Self-pay | Admitting: Gastroenterology

## 2016-06-20 ENCOUNTER — Ambulatory Visit (HOSPITAL_COMMUNITY)
Admission: RE | Admit: 2016-06-20 | Discharge: 2016-06-20 | Disposition: A | Discharge status: Home / Self Care | Payer: Self-pay | Source: Ambulatory Visit | Attending: Gastroenterology | Admitting: Gastroenterology

## 2016-06-20 DIAGNOSIS — N289 Disorder of kidney and ureter, unspecified: Secondary | ICD-10-CM | POA: Insufficient documentation

## 2016-06-20 MED ORDER — GADOBENATE DIMEGLUMINE 529 MG/ML IV SOLN
19.0000 mL | Freq: Once | INTRAVENOUS | Status: AC | PRN
  Administered 2016-06-20: 19 mL via INTRAVENOUS

## 2016-06-21 ENCOUNTER — Encounter: Payer: Self-pay | Admitting: Physician Assistant

## 2016-06-21 ENCOUNTER — Ambulatory Visit: Payer: Self-pay | Admitting: Physician Assistant

## 2016-06-21 VITALS — BP 118/80 | HR 90 | Temp 98.2°F | Ht 68.0 in | Wt 220.5 lb

## 2016-06-21 DIAGNOSIS — N281 Cyst of kidney, acquired: Secondary | ICD-10-CM

## 2016-06-21 DIAGNOSIS — B1920 Unspecified viral hepatitis C without hepatic coma: Secondary | ICD-10-CM

## 2016-06-21 DIAGNOSIS — F1911 Other psychoactive substance abuse, in remission: Secondary | ICD-10-CM

## 2016-06-21 DIAGNOSIS — F17210 Nicotine dependence, cigarettes, uncomplicated: Secondary | ICD-10-CM

## 2016-06-21 NOTE — Progress Notes (Signed)
BP 118/80 (BP Location: Left Arm, Patient Position: Sitting, Cuff Size: Normal)   Pulse 90   Temp 98.2 F (36.8 C) (Other (Comment))   Ht 5\' 8"  (1.727 m)   Wt 220 lb 8 oz (100 kg)   SpO2 98%   BMI 33.53 kg/m    Subjective:    Patient ID: Joseph Hines, male    DOB: 04-23-1986, 31 y.o.   MRN: [id number]  HPI: Joseph Hines is a 31 y.o. male presenting on 06/21/2016 for Follow-up   HPI   Pt says he hasn't heard on his [facility] application yet.  He says it was turned in end of November/early December  Pt had MRI yesterday.  Reviewed results with pt  Relevant past medical, surgical, family and social history reviewed and updated as indicated. Interim medical history since our last visit reviewed. Allergies and medications reviewed and updated.  No current outpatient prescriptions on file.   Review of Systems  Constitutional: Negative for appetite change, chills, diaphoresis, fatigue, fever and unexpected weight change.  HENT: Negative for congestion, dental problem, drooling, ear pain, facial swelling, hearing loss, mouth sores, sneezing, sore throat, trouble swallowing and voice change.   Eyes: Negative for pain, discharge, redness, itching and visual disturbance.  Respiratory: Negative for cough, choking, shortness of breath and wheezing.   Cardiovascular: Negative for chest pain, palpitations and leg swelling.  Gastrointestinal: Negative for abdominal pain, blood in stool, constipation, diarrhea and vomiting.  Endocrine: Negative for cold intolerance, heat intolerance and polydipsia.  Genitourinary: Negative for decreased urine volume, dysuria and hematuria.  Musculoskeletal: Negative for arthralgias, back pain and gait problem.  Skin: Negative for rash.  Allergic/Immunologic: Negative for environmental allergies.  Neurological: Negative for seizures, syncope, light-headedness and headaches.  Hematological: Negative for adenopathy.  Psychiatric/Behavioral: Negative for  agitation, dysphoric mood and suicidal ideas. The patient is not nervous/anxious.     Per HPI unless specifically indicated above     Objective:    BP 118/80 (BP Location: Left Arm, Patient Position: Sitting, Cuff Size: Normal)   Pulse 90   Temp 98.2 F (36.8 C) (Other (Comment))   Ht 5\' 8"  (1.727 m)   Wt 220 lb 8 oz (100 kg)   SpO2 98%   BMI 33.53 kg/m   Wt Readings from Last 3 Encounters:  06/21/16 220 lb 8 oz (100 kg)  05/19/16 220 lb (99.8 kg)  05/03/16 224 lb 9.6 oz (101.9 kg)    Physical Exam  Constitutional: He is oriented to person, place, and time. He appears well-developed and well-nourished.  HENT:  Head: Normocephalic and atraumatic.  Neck: Neck supple.  Cardiovascular: Normal rate and regular rhythm.   Pulmonary/Chest: Effort normal and breath sounds normal. He has no wheezes.  Abdominal: Soft. Bowel sounds are normal. He exhibits no ascites and no mass. There is no hepatosplenomegaly or hepatomegaly. There is no tenderness. There is no rigidity and no guarding.  Musculoskeletal: He exhibits no edema.  Lymphadenopathy:    He has no cervical adenopathy.  Neurological: He is alert and oriented to person, place, and time.  Skin: Skin is warm and dry.  Psychiatric: He has a normal mood and affect. His behavior is normal.  Vitals reviewed.       Assessment & Plan:   Encounter Diagnoses  Name Primary?  . Hepatitis C virus infection without hepatic coma, unspecified chronicity Yes  . Substance abuse in remission   . Cigarette nicotine dependence without complication   . Benign cyst of  left kidney     -nurse called to check on [facility].  [name] will be calling nurse back about [facility] APplication status -Pt to follow up with GI about hepatitis treatment -Follow up 3 month.  RTO sooner prn

## 2016-06-26 NOTE — Progress Notes (Signed)
See result note under MRI abd. Start process for [facility].

## 2016-06-26 NOTE — Progress Notes (Signed)
Left kidney lesion was a benign cyst. No further follow up needed.  Let's start process for [facility] for HCV treatment. He will be a patient assistance.

## 2016-06-28 ENCOUNTER — Telehealth: Payer: Self-pay | Admitting: Internal Medicine

## 2016-06-28 NOTE — Telephone Encounter (Signed)
Patient said she was just a support person for when he takes the medicine that he has not started.  He stated he would call her back though

## 2016-06-28 NOTE — Telephone Encounter (Signed)
Please tell the pt to call the patient assistance people back, they are probably calling him to get more information to finish processing his forms. They have not sent us anything.

## 2016-06-28 NOTE — Telephone Encounter (Signed)
[phone]  PLEASE CALL PATIENT, HE RECEIVED A CALL FROM A SUPPORT PERSON FOR THE HEPATITIS TREATMENT, HE STATES HE HAS NOT STARTED IT AND WANTS TO KNOW WHAT IS HAPPENING WITH THAT

## 2016-07-02 NOTE — Telephone Encounter (Signed)
I spoke with the pt, he said it was a nurse from [facility] that called him and was asking him about his medication. I called [facility] patient assistance and they dont have any record of his paperwork, which was faxed last week. Loistine Chancehilip was asking me about the paperwork and said he feels like the paperwork was just the benefits investigation form and the general abbvie assistance. He is going to get the correct paperwork faxed to me. I have called and explained all of this to the pt.

## 2016-07-07 ENCOUNTER — Emergency Department (HOSPITAL_COMMUNITY)
Admission: EM | Admit: 2016-07-07 | Discharge: 2016-07-07 | Disposition: A | Discharge status: Home / Self Care | Payer: Self-pay | Attending: Emergency Medicine | Admitting: Emergency Medicine

## 2016-07-07 ENCOUNTER — Encounter (HOSPITAL_COMMUNITY): Payer: Self-pay | Admitting: Emergency Medicine

## 2016-07-07 ENCOUNTER — Emergency Department (HOSPITAL_COMMUNITY): Payer: Self-pay

## 2016-07-07 DIAGNOSIS — F17210 Nicotine dependence, cigarettes, uncomplicated: Secondary | ICD-10-CM | POA: Insufficient documentation

## 2016-07-07 DIAGNOSIS — R1110 Vomiting, unspecified: Secondary | ICD-10-CM | POA: Insufficient documentation

## 2016-07-07 DIAGNOSIS — Z7982 Long term (current) use of aspirin: Secondary | ICD-10-CM | POA: Insufficient documentation

## 2016-07-07 DIAGNOSIS — L539 Erythematous condition, unspecified: Secondary | ICD-10-CM

## 2016-07-07 DIAGNOSIS — R51 Headache: Secondary | ICD-10-CM | POA: Insufficient documentation

## 2016-07-07 DIAGNOSIS — L03314 Cellulitis of groin: Secondary | ICD-10-CM | POA: Insufficient documentation

## 2016-07-07 MED ORDER — CLINDAMYCIN HCL 150 MG PO CAPS: ORAL_CAPSULE | ORAL | 0 refills | Status: DC

## 2016-07-07 MED ORDER — IBUPROFEN 600 MG PO TABS: 600.0000 mg | ORAL_TABLET | Freq: Four times a day (QID) | ORAL | 0 refills | Status: DC | PRN

## 2016-07-07 MED ORDER — KETOROLAC TROMETHAMINE 30 MG/ML IJ SOLN
30.0000 mg | Freq: Once | INTRAMUSCULAR | Status: AC
  Administered 2016-07-07: 30 mg via INTRAVENOUS
  Filled 2016-07-07: qty 1

## 2016-07-07 MED ORDER — ONDANSETRON HCL 4 MG/2ML IJ SOLN
4.0000 mg | Freq: Once | INTRAMUSCULAR | Status: AC
  Administered 2016-07-07: 4 mg via INTRAVENOUS
  Filled 2016-07-07: qty 2

## 2016-07-07 MED ORDER — CLINDAMYCIN PHOSPHATE 600 MG/50ML IV SOLN
600.0000 mg | Freq: Once | INTRAVENOUS | Status: AC
  Administered 2016-07-07: 600 mg via INTRAVENOUS
  Filled 2016-07-07: qty 50

## 2016-07-07 NOTE — ED Notes (Signed)
Left pt Dr came in

## 2016-07-07 NOTE — ED Provider Notes (Signed)
[facility]Y DEPT Provider Note   CSN: [id number] Arrival date & time: 07/07/16  6295     History   Chief Complaint Chief Complaint  Patient presents with  . Abscess    HPI Joseph Hines is a 31 y.o. male.  The history is provided by the patient.  Abscess  Location:  Pelvis Pelvic abscess location:  Groin Abscess quality: draining   Red streaking: no   Duration:  5 days Progression:  Worsening Chronicity:  New Context: not diabetes and not immunosuppression   Relieved by:  Nothing Worsened by:  Nothing Ineffective treatments:  Draining/squeezing and warm compresses Associated symptoms: headaches and vomiting   Risk factors: prior abscess     Past Medical History:  Diagnosis Date  . Traumatic brain injury [facility]) age 67    Patient Active Problem List   Diagnosis Date Noted  . Hepatitis C virus infection without hepatic coma 04/04/2016  . Alcohol use disorder, severe, dependence (HCC) 09/12/2015  . Opioid use disorder, moderate, dependence (HCC) 09/12/2015  . Tobacco use disorder 09/12/2015  . 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 Cocaine use disorder, severe, dependence (HCC) 09/11/2015  . Severe benzodiazepine use disorder (HCC) 09/11/2015  . Amphetamine use disorder, severe, dependence (HCC) 09/11/2015    Past Surgical History:  Procedure Laterality Date  . FEMUR FRACTURE SURGERY Right age 62  . LEG SURGERY     right   . TONSILLECTOMY         Home Medications    Prior to Admission medications   Not on File    Family History Family History  Problem Relation Age of Onset  . Breast cancer Mother   . Liver disease Neg Hx     Social History Social History  Substance Use Topics  . Smoking status: Current Every Day Smoker    Packs/day: 1.00    Years: 13.00    Types: Cigarettes  . Smokeless tobacco: Never Used  . Alcohol use No     Comment: hx alcoholism. sober since 09-08-15      Allergies   Patient has no known allergies.   Review of Systems Review of Systems  Constitutional: Negative for activity change.       All ROS Neg except as noted in HPI  HENT: Negative for nosebleeds.   Eyes: Negative for photophobia and discharge.  Respiratory: Negative for cough, shortness of breath and wheezing.   Cardiovascular: Negative for chest pain and palpitations.  Gastrointestinal: Positive for vomiting. Negative for abdominal pain and blood in stool.  Genitourinary: Negative for dysuria, frequency and hematuria.  Musculoskeletal: Negative for arthralgias, back pain and neck pain.  Skin: Positive for wound.  Neurological: Positive for headaches. Negative for dizziness, seizures and speech difficulty.  Psychiatric/Behavioral: Negative for confusion and hallucinations.     Physical Exam Updated Vital Signs BP 127/77 (BP Location: Right Arm)   Pulse 112   Temp 98.2 F (36.8 C) (Oral)   Resp 16   Ht 5\' 8"  (1.727 m)   Wt 99.8 kg   SpO2 95%   BMI 33.45 kg/m   Physical Exam  Constitutional: He is oriented to person, place, and time. He appears well-developed and well-nourished.  Non-toxic appearance.  HENT:  Head: Normocephalic.  Right Ear: Tympanic membrane and external ear normal.  Left Ear: Tympanic membrane and external ear normal.  Eyes: EOM and lids are normal. Pupils are equal, round, and reactive to light.  Neck: Normal range of motion.  Neck supple. Carotid bruit is not present.  Cardiovascular: Regular rhythm, normal heart sounds, intact distal pulses and normal pulses.  Tachycardia present.   Pulmonary/Chest: Breath sounds normal. No respiratory distress.  Abdominal: Soft. Bowel sounds are normal. There is no tenderness. There is no guarding.  Genitourinary:     Musculoskeletal: Normal range of motion.  Lymphadenopathy:       Head (right side): No submandibular adenopathy present.       Head (left side): No submandibular adenopathy present.     He has no cervical adenopathy.  Neurological: He is alert and oriented to person, place, and time. He has normal strength. No cranial nerve deficit or sensory deficit.  Skin: Skin is warm and dry.  Psychiatric: He has a normal mood and affect. His speech is normal.  Nursing note and vitals reviewed.    ED Treatments / Results  Labs (all labs ordered are listed, but only abnormal results are displayed) Labs Reviewed - No data to display  EKG  EKG Interpretation None       Radiology No results found.  Procedures Procedures (including critical care time)  Medications Ordered in ED Medications - No data to display   Initial Impression / Assessment and Plan / ED Course  I have reviewed the triage vital signs and the nursing notes.  Pertinent labs & imaging results that were available during my care of the patient were reviewed by me and considered in my medical decision making (see chart for details).     *I have reviewed nursing notes, vital signs, and all appropriate lab and imaging results for this patient.  Final Clinical Impressions(s) / ED Diagnoses  MDM Vital signs reviewed. Patient has a tachycardia present, but otherwise within normal limits.  Ultrasound reveals a small fluid collection in the subcutaneous soft tissue with apparent tract extending to the skin surface. The surrounding edema is believed to reflect cellulitis. No evidence of Fournier's  gangrene The patient is started on IV clindamycin. Pain improved after IV Toradol and Zofran.  Patient tolerated clindamycin without problem.  Patient will use warm tub soaks. Prescription for clindamycin and ibuprofen given to the patient. The patient will follow-up with primary physician, or return to the emergency department if any signs of advancing infection.    Final diagnoses:  None    New Prescriptions New Prescriptions   No medications on file     Ivery QualeHobson Spyridon Hornstein, Cordelia Poche-C 07/09/16 1915    Donnetta HutchingBrian  Cook, MD 07/10/16 0900

## 2016-07-07 NOTE — ED Notes (Signed)
Went to update vitals pt gone to radiology 

## 2016-07-07 NOTE — ED Triage Notes (Signed)
Patient c/o abscess to right groin that appeared 5 days ago after shaving. Per patient slight drainage after taking hot shower and using peroxide. Per patient swelling into base of penis. Patient also reports fever with chills last night,

## 2016-07-07 NOTE — Discharge Instructions (Signed)
Your ultrasound reveals primarily cellulitis involving her right groin. Please soak in a warm tub of Epsom salt water 1 or 2 times daily until this has resolved. Please use 2 tablets of clindamycin 3 times daily with a meal. Please use ibuprofen and 500 mg of Tylenol with breakfast, lunch, dinner, and at bedtime. Please see Mr. McElroy or return to the emergency department if signs of advancing infection.

## 2016-07-09 NOTE — Telephone Encounter (Signed)
New forms were faxed to us. They have been filled out and signed by pt and LSL and faxed to the pt assistance company.

## 2016-08-09 ENCOUNTER — Telehealth: Payer: Self-pay | Admitting: Internal Medicine

## 2016-08-09 NOTE — Telephone Encounter (Signed)
Pt called asking to speak with the nurse about his Hepatitis medicine. Please call him at [phone]

## 2016-08-15 NOTE — Telephone Encounter (Signed)
I spoke with the pt, he has not heard anything from pt assistance for hep c medication. I informed him that I just recently received a fax from them stating the pt needs to apply for medicaid. If he is denied they will approve his medication. The pt said he has already applied for medicaid and he has a letter and he will bring it to the office so we can make a copy and send it to pt assistance.   Please make a copy of the letter when pt brings it in. Thanks.

## 2016-08-26 ENCOUNTER — Emergency Department
Admission: EM | Admit: 2016-08-26 | Discharge: 2016-08-26 | Disposition: A | Discharge status: Home / Self Care | Payer: Self-pay | Attending: Emergency Medicine | Admitting: Emergency Medicine

## 2016-08-26 ENCOUNTER — Emergency Department (HOSPITAL_COMMUNITY)
Admission: EM | Admit: 2016-08-26 | Discharge: 2016-08-26 | Disposition: A | Discharge status: Home / Self Care | Payer: Self-pay | Attending: Emergency Medicine | Admitting: Emergency Medicine

## 2016-08-26 ENCOUNTER — Encounter (HOSPITAL_COMMUNITY): Payer: Self-pay | Admitting: Emergency Medicine

## 2016-08-26 DIAGNOSIS — Z7982 Long term (current) use of aspirin: Secondary | ICD-10-CM | POA: Insufficient documentation

## 2016-08-26 DIAGNOSIS — L0211 Cutaneous abscess of neck: Secondary | ICD-10-CM | POA: Insufficient documentation

## 2016-08-26 DIAGNOSIS — Z23 Encounter for immunization: Secondary | ICD-10-CM | POA: Insufficient documentation

## 2016-08-26 DIAGNOSIS — F17210 Nicotine dependence, cigarettes, uncomplicated: Secondary | ICD-10-CM | POA: Insufficient documentation

## 2016-08-26 DIAGNOSIS — Z5321 Procedure and treatment not carried out due to patient leaving prior to being seen by health care provider: Secondary | ICD-10-CM | POA: Insufficient documentation

## 2016-08-26 MED ORDER — CEPHALEXIN 500 MG PO CAPS
500.0000 mg | ORAL_CAPSULE | Freq: Once | ORAL | Status: AC
  Administered 2016-08-26: 500 mg via ORAL
  Filled 2016-08-26: qty 1

## 2016-08-26 MED ORDER — SULFAMETHOXAZOLE-TRIMETHOPRIM 800-160 MG PO TABS
1.0000 | ORAL_TABLET | Freq: Once | ORAL | Status: AC
  Administered 2016-08-26: 1 via ORAL
  Filled 2016-08-26: qty 1

## 2016-08-26 MED ORDER — DICLOFENAC SODIUM 50 MG PO TBEC: 50.0000 mg | DELAYED_RELEASE_TABLET | Freq: Two times a day (BID) | ORAL | 0 refills | Status: DC

## 2016-08-26 MED ORDER — LIDOCAINE HCL (PF) 1 % IJ SOLN
5.0000 mL | Freq: Once | INTRAMUSCULAR | Status: AC
Start: 2016-08-26 — End: 2016-08-26
  Administered 2016-08-26: 5 mL
  Filled 2016-08-26: qty 5

## 2016-08-26 MED ORDER — CEPHALEXIN 500 MG PO CAPS: 500.0000 mg | ORAL_CAPSULE | Freq: Four times a day (QID) | ORAL | 0 refills | Status: DC

## 2016-08-26 MED ORDER — TETANUS-DIPHTH-ACELL PERTUSSIS 5-2.5-18.5 LF-MCG/0.5 IM SUSP
0.5000 mL | Freq: Once | INTRAMUSCULAR | Status: AC
Start: 2016-08-26 — End: 2016-08-26
  Administered 2016-08-26: 0.5 mL via INTRAMUSCULAR
  Filled 2016-08-26: qty 0.5

## 2016-08-26 MED ORDER — SULFAMETHOXAZOLE-TRIMETHOPRIM 800-160 MG PO TABS: 1.0000 | ORAL_TABLET | Freq: Two times a day (BID) | ORAL | 0 refills | Status: AC

## 2016-08-26 NOTE — ED Triage Notes (Signed)
Abcess to Rt side of neck since Friday

## 2016-08-26 NOTE — ED Notes (Signed)
Called for patient at 49 and [phone]. No response.

## 2016-08-26 NOTE — ED Provider Notes (Signed)
[facility] Provider Note   CSN: [id number] Arrival date & time: 08/26/16  1810  By signing my name below, I, Nelwyn Salisbury, attest that this documentation has been prepared under the direction and in the presence of non-physician practitioner, Kerrie Buffalo, NP. Electronically Signed: Nelwyn Salisbury, Scribe. 08/26/2016. 6:38 PM.   History   Chief Complaint Chief Complaint  Patient presents with  . Recurrent Skin Infections   The history is provided by the patient. No language interpreter was used.  Abscess  Location:  Head/neck Head/neck abscess location:  R neck Size:  3cm Abscess quality: draining and painful   Abscess quality: no fluctuance   Progression:  Unchanged Pain details:    Severity:  Moderate   Timing:  Constant   Progression:  Worsening Chronicity:  New Relieved by:  Nothing Worsened by:  Nothing Ineffective treatments:  Warm compresses and warm water soaks Associated symptoms: no fever, no headaches, no nausea and no vomiting   Risk factors: prior abscess     HPI Comments: Joseph Hines is a 31 y.o. male who presents to the Emergency Department complaining of a moderate, gradually worsening area of pain and swelling to the right side of his neck onset two days. He states that he has had similar symptoms in the past and has been dx with abscesses.  Pt states pain is exacerbated with palpation and direct pressure. Denies fever, chills. There has been drainage from the area after he applied warm compresses and then tried to squeeze the area. Pt tetanus unknown.   Past Medical History:  Diagnosis Date  . Traumatic brain injury [facility]) age 60    Patient Active Problem List   Diagnosis Date Noted  . Hepatitis C virus infection without hepatic coma 04/04/2016  . Alcohol use disorder, severe, dependence (HCC) 09/12/2015  . Opioid use disorder, moderate, dependence (HCC) 09/12/2015  . Tobacco use disorder 09/12/2015  . 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 Cocaine use disorder, severe, dependence (HCC) 09/11/2015  . Severe benzodiazepine use disorder (HCC) 09/11/2015  . Amphetamine use disorder, severe, dependence (HCC) 09/11/2015    Past Surgical History:  Procedure Laterality Date  . FEMUR FRACTURE SURGERY Right age 77  . LEG SURGERY     right   . TONSILLECTOMY         Home Medications    Prior to Admission medications   Medication Sig Start Date End Date Taking? Authorizing Provider  Aspirin-Caffeine 845-65 MG PACK Take 1 packet by mouth daily as needed (pain).    Historical Provider, MD  cephALEXin (KEFLEX) 500 MG capsule Take 1 capsule (500 mg total) by mouth 4 (four) times daily. 08/26/16   Abigail Marsiglia Orlene Och, NP  clindamycin (CLEOCIN) 150 MG capsule 2 po tid 07/07/16   Ivery Quale, PA-C  diclofenac (VOLTAREN) 50 MG EC tablet Take 1 tablet (50 mg total) by mouth 2 (two) times daily. 08/26/16   Florine Sprenkle Orlene Och, NP  ibuprofen (ADVIL,MOTRIN) 600 MG tablet Take 1 tablet (600 mg total) by mouth every 6 (six) hours as needed. 07/07/16   Ivery Quale, PA-C  sulfamethoxazole-trimethoprim (BACTRIM DS,SEPTRA DS) 800-160 MG tablet Take 1 tablet by mouth 2 (two) times daily. 08/26/16 09/02/16  Zayne Marovich Orlene Och, NP    Family History Family History  Problem Relation Age of Onset  . Breast cancer Mother   . Liver disease Neg Hx     Social History Social History  Substance Use Topics  . Smoking  status: Current Every Day Smoker    Packs/day: 1.00    Years: 13.00    Types: Cigarettes  . Smokeless tobacco: Never Used  . Alcohol use No     Comment: hx alcoholism. sober since 09-08-15     Allergies   Patient has no known allergies.   Review of Systems Review of Systems  Constitutional: Negative for chills and fever.  Gastrointestinal: Negative for nausea and vomiting.  Musculoskeletal: Positive for myalgias. Neck pain: at area of abscess.  Skin: Positive for wound.  Neurological: Negative  for headaches.     Physical Exam Updated Vital Signs BP 124/74 (BP Location: Right Arm)   Pulse (!) 101   Temp 98.7 F (37.1 C)   Resp 18   Ht  (1.727 m)   Wt 97.5 kg   SpO2 96%   BMI 32.69 kg/m   Physical Exam  Constitutional: He is oriented to person, place, and time. He appears well-developed and well-nourished. No distress.  HENT:  Head: Normocephalic and atraumatic.  Eyes: Conjunctivae are normal.  Neck: Neck supple.  Abscess to right side of neck.  Cardiovascular: Normal rate.   Pulmonary/Chest: Effort normal.  Abdominal: He exhibits no distension.  Musculoskeletal: Normal range of motion.  Neurological: He is alert and oriented to person, place, and time.  Skin: Skin is warm and dry.  3cm area that is firm and tender with erythema. Center is pustular with small amount of drainage. No fluctuant area.    Psychiatric: He has a normal mood and affect.  Nursing note and vitals reviewed.    ED Treatments / Results  DIAGNOSTIC STUDIES:  Oxygen Saturation is 96% on RA, normal by my interpretation.    COORDINATION OF CARE:  6:44 PM Discussed with the patient that I&D may not produce much drainage due to the firmness of the area. Patient request trying to I&D. (all labs ordered are listed, but only abnormal results are displayed) Labs Reviewed - No data to display  Radiology No results found.  Procedures .Marland KitchenIncision and Drainage Date/Time: 08/26/2016 7:13 PM Performed by: Janne Napoleon Authorized by: Janne Napoleon   Consent:    Consent obtained:  Verbal   Consent given by:  Patient   Risks discussed:  Pain, bleeding and incomplete drainage   Alternatives discussed:  No treatment, delayed treatment and alternative treatment Location:    Type:  Abscess   Size:  3cm   Location:  Neck   Neck location:  R posterior Pre-procedure details:    Skin preparation:  Betadine Anesthesia (see MAR for exact dosages):    Anesthesia method:  Local infiltration    Local anesthetic:  Lidocaine 2% w/o epi Procedure type:    Complexity:  Complex Procedure details:    Needle aspiration: no     Incision types:  Single straight   Incision depth:  Dermal   Scalpel blade:  11   Wound management:  Probed and deloculated and irrigated with saline   Drainage:  Bloody and purulent   Drainage amount:  Scant   Wound treatment:  Wound left open   Packing materials:  None Post-procedure details:    Patient tolerance of procedure:  Tolerated well, no immediate complications Comments:     Tetanus updated.    (including critical care time)  Medications Ordered in ED Medications  Tdap (BOOSTRIX) injection 0.5 mL (0.5 mLs Intramuscular Given 08/26/16 1855)  lidocaine (PF) (XYLOCAINE) 1 % injection 5 mL (5 mLs Infiltration Given 08/26/16 1856)  cephALEXin (KEFLEX) capsule 500 mg (500 mg Oral Given 08/26/16 1926)  sulfamethoxazole-trimethoprim (BACTRIM DS,SEPTRA DS) 800-160 MG per tablet 1 tablet (1 tablet Oral Given 08/26/16 1926)    Initial Impression / Assessment and Plan / ED Course  I have reviewed the triage vital signs and the nursing notes. Patient with skin abscess. Incision and drainage performed in the ED today.  Abscess was not large enough to warrant packing or drain placement. Wound recheck in 2 days. Supportive care and return precautions discussed.  Pt sent home with Keflex and Bactrim DS. The patient appears reasonably screened and/or stabilized for discharge and I doubt any other emergent medical condition requiring further screening, evaluation, or treatment in the ED prior to discharge.  Final Clinical Impressions(s) / ED Diagnoses   Final diagnoses:  Abscess, neck    New Prescriptions Discharge Medication List as of 08/26/2016  7:24 PM    START taking these medications   Details  cephALEXin (KEFLEX) 500 MG capsule Take 1 capsule (500 mg total) by mouth 4 (four) times daily., Starting Sun 08/26/2016, Print    diclofenac (VOLTAREN) 50 MG EC  tablet Take 1 tablet (50 mg total) by mouth 2 (two) times daily., Starting Sun 08/26/2016, Print    sulfamethoxazole-trimethoprim (BACTRIM DS,SEPTRA DS) 800-160 MG tablet Take 1 tablet by mouth 2 (two) times daily., Starting Sun 08/26/2016, Until Sun 09/02/2016, Print      I personally performed the services described in this documentation, which was scribed in my presence. The recorded information has been reviewed and is accurate.     [place], NP 08/27/16 1904    Mancel Bale, MD 08/28/16 [phone]

## 2016-08-26 NOTE — ED Notes (Signed)
Pt call for room placement  No answer in lobby

## 2016-08-26 NOTE — ED Triage Notes (Signed)
Patient reports he has an area on his neck that he feels is infected.  Patient reports history of multiple abscesses.

## 2016-09-19 ENCOUNTER — Ambulatory Visit: Payer: Self-pay | Admitting: Physician Assistant

## 2016-09-19 ENCOUNTER — Encounter: Payer: Self-pay | Admitting: Physician Assistant

## 2016-09-19 VITALS — BP 96/74 | HR 90 | Temp 97.9°F | Ht 68.0 in | Wt 215.0 lb

## 2016-09-19 DIAGNOSIS — B1920 Unspecified viral hepatitis C without hepatic coma: Secondary | ICD-10-CM

## 2016-09-19 DIAGNOSIS — F17210 Nicotine dependence, cigarettes, uncomplicated: Secondary | ICD-10-CM

## 2016-09-19 DIAGNOSIS — F1911 Other psychoactive substance abuse, in remission: Secondary | ICD-10-CM

## 2016-09-19 NOTE — Progress Notes (Signed)
BP 96/74 (BP Location: Left Arm, Patient Position: Sitting, Cuff Size: Normal)   Pulse 90   Temp 97.9 F (36.6 C)   Ht  (1.727 m)   Wt 215 lb (97.5 kg)   SpO2 94%   BMI 32.69 kg/m    Subjective:    Patient ID: Joseph Hines, male    DOB: 09-09-1985, 31 y.o.   MRN: [id number]  HPI: Anothony Hines is a 31 y.o. male presenting on 09/19/2016 for Follow-up   HPI   Pt had to apply to medicaid and get denied in order to get approved for pt assistance for his hepatitis medication.  He says he was told it could take up to 90 days to get reponse.   Pt states his abscesses have resolved.    He says he is doing well.   Relevant past medical, surgical, family and social history reviewed and updated as indicated. Interim medical history since our last visit reviewed. Allergies and medications reviewed and updated.  No current outpatient prescriptions on file.   Review of Systems  Constitutional: Negative for appetite change, chills, diaphoresis, fatigue, fever and unexpected weight change.  HENT: Negative for congestion, dental problem, drooling, ear pain, facial swelling, hearing loss, mouth sores, sneezing, sore throat, trouble swallowing and voice change.   Eyes: Negative for pain, discharge, redness, itching and visual disturbance.  Respiratory: Negative for cough, choking, shortness of breath and wheezing.   Cardiovascular: Negative for chest pain, palpitations and leg swelling.  Gastrointestinal: Negative for abdominal pain, blood in stool, constipation, diarrhea and vomiting.  Endocrine: Negative for cold intolerance, heat intolerance and polydipsia.  Genitourinary: Negative for decreased urine volume, dysuria and hematuria.  Musculoskeletal: Negative for arthralgias, back pain and gait problem.  Skin: Negative for rash.  Allergic/Immunologic: Negative for environmental allergies.  Neurological: Negative for seizures, syncope, light-headedness and headaches.  Hematological:  Negative for adenopathy.  Psychiatric/Behavioral: Negative for agitation, dysphoric mood and suicidal ideas. The patient is not nervous/anxious.     Per HPI unless specifically indicated above     Objective:    BP 96/74 (BP Location: Left Arm, Patient Position: Sitting, Cuff Size: Normal)   Pulse 90   Temp 97.9 F (36.6 C)   Ht  (1.727 m)   Wt 215 lb (97.5 kg)   SpO2 94%   BMI 32.69 kg/m   Wt Readings from Last 3 Encounters:  09/19/16 215 lb (97.5 kg)  08/26/16 215 lb (97.5 kg)  08/26/16 215 lb (97.5 kg)    Physical Exam  Constitutional: He is oriented to person, place, and time. He appears well-developed and well-nourished.  HENT:  Head: Normocephalic and atraumatic.  Neck: Neck supple.  Cardiovascular: Normal rate and regular rhythm.   Pulmonary/Chest: Effort normal and breath sounds normal. He has no wheezes.  Abdominal: Soft. Bowel sounds are normal. There is no hepatosplenomegaly. There is no tenderness.  Musculoskeletal: He exhibits no edema.  Lymphadenopathy:    He has no cervical adenopathy.  Neurological: He is alert and oriented to person, place, and time.  Skin: Skin is warm and dry.  Psychiatric: He has a normal mood and affect. His behavior is normal.  Vitals reviewed.       Assessment & Plan:     Encounter Diagnoses  Name Primary?  . 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 Cigarette nicotine dependence without complication   . Hepatitis C virus infection without hepatic coma, unspecified chronicity    -pt to continue with GI for  his hepatitis -Pt to follow up in 6 months.  He is to RTO sooner prn

## 2016-10-01 ENCOUNTER — Emergency Department (HOSPITAL_COMMUNITY)
Admission: EM | Admit: 2016-10-01 | Discharge: 2016-10-01 | Disposition: A | Discharge status: Home / Self Care | Payer: Self-pay | Attending: Emergency Medicine | Admitting: Emergency Medicine

## 2016-10-01 ENCOUNTER — Encounter (HOSPITAL_COMMUNITY): Payer: Self-pay | Admitting: Cardiology

## 2016-10-01 DIAGNOSIS — R110 Nausea: Secondary | ICD-10-CM | POA: Insufficient documentation

## 2016-10-01 DIAGNOSIS — F17210 Nicotine dependence, cigarettes, uncomplicated: Secondary | ICD-10-CM | POA: Insufficient documentation

## 2016-10-01 DIAGNOSIS — L03116 Cellulitis of left lower limb: Secondary | ICD-10-CM

## 2016-10-01 DIAGNOSIS — L0291 Cutaneous abscess, unspecified: Secondary | ICD-10-CM

## 2016-10-01 DIAGNOSIS — L02416 Cutaneous abscess of left lower limb: Secondary | ICD-10-CM | POA: Insufficient documentation

## 2016-10-01 LAB — CBC WITH DIFFERENTIAL/PLATELET
BASOS ABS: 0 10*3/uL (ref 0.0–0.1)
BASOS PCT: 0 %
Eosinophils Absolute: 0.3 10*3/uL (ref 0.0–0.7)
Eosinophils Relative: 2 %
HEMATOCRIT: 38.8 % — AB (ref 39.0–52.0)
HEMOGLOBIN: 13.2 g/dL (ref 13.0–17.0)
Lymphocytes Relative: 14 %
Lymphs Abs: 1.8 10*3/uL (ref 0.7–4.0)
MCH: 30.3 pg (ref 26.0–34.0)
MCHC: 34 g/dL (ref 30.0–36.0)
MCV: 89 fL (ref 78.0–100.0)
MONOS PCT: 9 %
Monocytes Absolute: 1.1 10*3/uL — ABNORMAL HIGH (ref 0.1–1.0)
NEUTROS ABS: 9.4 10*3/uL — AB (ref 1.7–7.7)
NEUTROS PCT: 75 %
Platelets: 178 10*3/uL (ref 150–400)
RBC: 4.36 MIL/uL (ref 4.22–5.81)
RDW: 13.6 % (ref 11.5–15.5)
WBC: 12.5 10*3/uL — AB (ref 4.0–10.5)

## 2016-10-01 LAB — COMPREHENSIVE METABOLIC PANEL
ALK PHOS: 41 U/L (ref 38–126)
ALT: 30 U/L (ref 17–63)
ANION GAP: 9 (ref 5–15)
AST: 30 U/L (ref 15–41)
Albumin: 3.8 g/dL (ref 3.5–5.0)
BUN: 11 mg/dL (ref 6–20)
CALCIUM: 8.6 mg/dL — AB (ref 8.9–10.3)
CO2: 26 mmol/L (ref 22–32)
Chloride: 102 mmol/L (ref 101–111)
Creatinine, Ser: 0.97 mg/dL (ref 0.61–1.24)
GFR calc non Af Amer: >60 mL/min (ref >60)
Glucose, Bld: 107 mg/dL — ABNORMAL HIGH (ref 65–99)
Potassium: 3.7 mmol/L (ref 3.5–5.1)
SODIUM: 137 mmol/L (ref 135–145)
Total Bilirubin: 0.4 mg/dL (ref 0.3–1.2)
Total Protein: 6.5 g/dL (ref 6.5–8.1)

## 2016-10-01 LAB — LACTIC ACID, PLASMA: Lactic Acid, Venous: 0.8 mmol/L (ref 0.5–1.9)

## 2016-10-01 MED ORDER — SODIUM CHLORIDE 0.9 % IV BOLUS (SEPSIS)
1000.0000 mL | Freq: Once | INTRAVENOUS | Status: AC
  Administered 2016-10-01: 1000 mL via INTRAVENOUS

## 2016-10-01 MED ORDER — LIDOCAINE HCL (PF) 1 % IJ SOLN
10.0000 mL | Freq: Once | INTRAMUSCULAR | Status: AC
  Administered 2016-10-01: 10 mL
  Filled 2016-10-01: qty 10

## 2016-10-01 MED ORDER — HYDROCODONE-ACETAMINOPHEN 5-325 MG PO TABS: 1.0000 | ORAL_TABLET | Freq: Four times a day (QID) | ORAL | 0 refills | Status: DC | PRN

## 2016-10-01 MED ORDER — VANCOMYCIN HCL IN DEXTROSE 1-5 GM/200ML-% IV SOLN
1000.0000 mg | Freq: Once | INTRAVENOUS | Status: AC
  Administered 2016-10-01: 1000 mg via INTRAVENOUS
  Filled 2016-10-01: qty 200

## 2016-10-01 MED ORDER — ONDANSETRON HCL 4 MG/2ML IJ SOLN
4.0000 mg | Freq: Once | INTRAMUSCULAR | Status: AC
  Administered 2016-10-01: 4 mg via INTRAVENOUS
  Filled 2016-10-01: qty 2

## 2016-10-01 MED ORDER — NAPROXEN 500 MG PO TABS: 500.0000 mg | ORAL_TABLET | Freq: Two times a day (BID) | ORAL | 0 refills | Status: DC

## 2016-10-01 MED ORDER — ACETAMINOPHEN 500 MG PO TABS
1000.0000 mg | ORAL_TABLET | Freq: Once | ORAL | Status: AC
  Administered 2016-10-01: 1000 mg via ORAL
  Filled 2016-10-01: qty 2

## 2016-10-01 MED ORDER — DOXYCYCLINE HYCLATE 100 MG PO CAPS: 100.0000 mg | ORAL_CAPSULE | Freq: Two times a day (BID) | ORAL | 0 refills | Status: DC

## 2016-10-01 MED ORDER — SODIUM CHLORIDE 0.9 % IV SOLN
INTRAVENOUS | Status: DC
  Administered 2016-10-01: 16:00:00 via INTRAVENOUS

## 2016-10-01 MED ORDER — HYDROMORPHONE HCL 1 MG/ML IJ SOLN
1.0000 mg | Freq: Once | INTRAMUSCULAR | Status: AC
  Administered 2016-10-01: 1 mg via INTRAVENOUS
  Filled 2016-10-01: qty 1

## 2016-10-01 NOTE — ED Provider Notes (Addendum)
[facility]-EMERGENCY DEPT Provider Note   CSN: [id number] Arrival date & time: 10/01/16  1508     History   Chief Complaint Chief Complaint  Patient presents with  . Abscess    HPI Joseph Hines is a 31 y.o. male.  Patient with recurrent the skin infection to the left thigh area. Started a couple days ago. Today patient felt not well felt as if he has got fever. Felt very fatigued at work. Recently had a skin abscess to his neck several weeks ago. Not sure why she keeps getting recurrent infections.      Past Medical History:  Diagnosis Date  . Traumatic brain injury [facility]) age 87    Patient Active Problem List   Diagnosis Date Noted  . Hepatitis C virus infection without hepatic coma 04/04/2016  . Alcohol use disorder, severe, dependence (HCC) 09/12/2015  . Opioid use disorder, moderate, dependence (HCC) 09/12/2015  . Tobacco use disorder 09/12/2015  . 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 Cocaine use disorder, severe, dependence (HCC) 09/11/2015  . Severe benzodiazepine use disorder (HCC) 09/11/2015  . Amphetamine use disorder, severe, dependence (HCC) 09/11/2015    Past Surgical History:  Procedure Laterality Date  . FEMUR FRACTURE SURGERY Right age 78  . LEG SURGERY     right   . TONSILLECTOMY         Home Medications    Prior to Admission medications   Medication Sig Start Date End Date Taking? Authorizing Provider  doxycycline (VIBRAMYCIN) 100 MG capsule Take 1 capsule (100 mg total) by mouth 2 (two) times daily. 10/01/16   Vanetta Mulders, MD  HYDROcodone-acetaminophen (NORCO/VICODIN) 5-325 MG tablet Take 1-2 tablets by mouth every 6 (six) hours as needed. 10/01/16   Vanetta Mulders, MD  naproxen (NAPROSYN) 500 MG tablet Take 1 tablet (500 mg total) by mouth 2 (two) times daily. 10/01/16   Vanetta Mulders, MD    Family History Family History  Problem Relation Age of Onset  . Breast cancer Mother    . Liver disease Neg Hx     Social History Social History  Substance Use Topics  . Smoking status: Current Every Day Smoker    Packs/day: 1.00    Years: 13.00    Types: Cigarettes  . Smokeless tobacco: Never Used  . Alcohol use No     Comment: hx alcoholism. sober since 09-08-15     Allergies   Patient has no known allergies.   Review of Systems Review of Systems  Constitutional: Positive for fever.  HENT: Negative for congestion.   Eyes: Negative for redness.  Respiratory: Negative for shortness of breath.   Cardiovascular: Negative for chest pain.  Gastrointestinal: Positive for nausea. Negative for abdominal pain and vomiting.  Genitourinary: Negative for dysuria.  Musculoskeletal: Negative for back pain.  Skin: Positive for wound.  Neurological: Negative for headaches.  Hematological: Does not bruise/bleed easily.  Psychiatric/Behavioral: Negative for confusion.     Physical Exam Updated Vital Signs BP 101/78   Pulse 95   Temp 98.6 F (37 C) (Oral)   Resp (!) 21   Ht 5\' 10"  (1.778 m)   Wt 97.5 kg   SpO2 95%   BMI 30.85 kg/m   Physical Exam  Constitutional: He is oriented to person, place, and time. He appears well-developed and well-nourished.  HENT:  Head: Normocephalic.  Mouth/Throat: Oropharynx is clear and moist.  Eyes: EOM are normal. Pupils are equal, round, and reactive to light.  Neck: Normal range of motion. Neck supple.  Cardiovascular: Normal rate, regular rhythm and normal heart sounds.   Pulmonary/Chest: Effort normal and breath sounds normal.  Abdominal: Soft. Bowel sounds are normal. There is no tenderness.  Musculoskeletal: He exhibits tenderness.  Left thigh with area of erythema measuring about 10 x 15 cm. Central area of the some scabbing and induration and fluctuance measuring about 3 x 3 cm.  Neurological: He is alert and oriented to person, place, and time. No cranial nerve deficit or sensory deficit. He exhibits normal muscle  tone. Coordination normal.  Skin: Skin is warm. There is erythema.  Nursing note and vitals reviewed.    ED Treatments / Results  Labs (all labs ordered are listed, but only abnormal results are displayed) Labs Reviewed  COMPREHENSIVE METABOLIC PANEL - Abnormal; Notable for the following:       Result Value   Glucose, Bld 107 (*)    Calcium 8.6 (*)    All other components within normal limits  CBC WITH DIFFERENTIAL/PLATELET - Abnormal; Notable for the following:    WBC 12.5 (*)    HCT 38.8 (*)    Neutro Abs 9.4 (*)    Monocytes Absolute 1.1 (*)    All other components within normal limits  CULTURE, BLOOD (ROUTINE X 2)  CULTURE, BLOOD (ROUTINE X 2)  LACTIC ACID, PLASMA    EKG  EKG Interpretation None       Radiology No results found.  Procedures Procedures (including critical care time)  Medications Ordered in ED Medications  0.9 %  sodium chloride infusion ( Intravenous New Bag/Given 10/01/16 1616)  lidocaine (PF) (XYLOCAINE) 1 % injection 10 mL (not administered)  HYDROmorphone (DILAUDID) injection 1 mg (1 mg Intravenous Given 10/01/16 1615)  ondansetron (ZOFRAN) injection 4 mg (4 mg Intravenous Given 10/01/16 1615)  sodium chloride 0.9 % bolus 1,000 mL (1,000 mLs Intravenous New Bag/Given 10/01/16 1616)  vancomycin (VANCOCIN) IVPB 1000 mg/200 mL premix (0 mg Intravenous Stopped 10/01/16 1731)  acetaminophen (TYLENOL) tablet 1,000 mg (1,000 mg Oral Given 10/01/16 1615)     Initial Impression / Assessment and Plan / ED Course  I have reviewed the triage vital signs and the nursing notes.  Pertinent labs & imaging results that were available during my care of the patient were reviewed by me and considered in my medical decision making (see chart for details).     The patient with the cellulitis and abscess to the left thigh area. Patient's had some problems with skin abscesses of late. Last one was on the neck of several weeks ago. Patient's vital signs he was tachycardic  he did have a fever workup with normal lactic acid mild leukocytosis milliliter fluid.tachycardia resolved. Patient nontoxic no acute distress. No hypotension. Cellulitis improved significantly with the IV vancomycin.  Patient received 1 g of vancomycin here. Abscess was I&D by physician assistant at a shared visit. Her noted separate. Patient we discharged home on doxycycline work note and wound care instructions.  In addition aware of patient's past substance abuse problems. But does have a fairly significant abscess I&D short course of hydrocodone 10 tablets total provided.  Final Clinical Impressions(s) / ED Diagnoses   Final diagnoses:  Cellulitis of left lower extremity  Abscess    Medical screening examination/treatment/procedure(s) were conducted as a shared visit with non-physician practitioner(s) and myself.  I personally evaluated the patient during the encounter.   EKG Interpretation None       New Prescriptions New Prescriptions  DOXYCYCLINE (VIBRAMYCIN) 100 MG CAPSULE    Take 1 capsule (100 mg total) by mouth 2 (two) times daily.   HYDROCODONE-ACETAMINOPHEN (NORCO/VICODIN) 5-325 MG TABLET    Take 1-2 tablets by mouth every 6 (six) hours as needed.   NAPROXEN (NAPROSYN) 500 MG TABLET    Take 1 tablet (500 mg total) by mouth 2 (two) times daily.     Vanetta Mulders, MD 10/01/16 1610    Vanetta Mulders, MD 10/01/16 [phone]

## 2016-10-01 NOTE — ED Triage Notes (Signed)
Abscess to left hip times 2 days.

## 2016-10-01 NOTE — Discharge Instructions (Signed)
Take antibiotic as directed. The the packing in the wound for 2 days then removed. Follow-up with your doctor. Return for any new or worse symptoms. Take the medication provided for pain. Work note provided to be out of work for the next 2 days.

## 2016-10-01 NOTE — ED Provider Notes (Signed)
I was asked to I&D pt's abscess by Dr. Deretha EmoryZackowski.    INCISION AND DRAINAGE Performed by: Burgess AmorIDOL, Joseph Hines Consent: Verbal consent obtained. Risks and benefits: risks, benefits and alternatives were discussed Type: abscess  Body area: left hip  Anesthesia: local infiltration  Incision was made with a scalpel after cleaning skin with Safe clens skin spray.  Local anesthetic: lidocaine 1% without epinephrine  Anesthetic total: 4 ml  Complexity: complex Blunt dissection to break up loculations  Drainage: purulent  Drainage amount: moderate  Packing material: 1/4 in iodoform gauze  Patient tolerance: Patient tolerated the procedure well with no immediate complications.  Dispo of pt per Dr. Deretha EmoryZackowski.      Burgess Amordol, Joseph Hark, PA-C 10/01/16 1819    Vanetta MuldersZackowski, Scott, MD 10/09/16 [phone]

## 2016-10-06 LAB — CULTURE, BLOOD (ROUTINE X 2)
CULTURE: NO GROWTH
Culture: NO GROWTH

## 2016-10-24 ENCOUNTER — Telehealth: Payer: Self-pay | Admitting: Gastroenterology

## 2016-10-24 NOTE — Telephone Encounter (Signed)
[facility], pt applied and was denied and he has brought in copy of denial recently. I have faxed it in to the patient assistance co. Waiting for their response.

## 2016-10-24 NOTE — Telephone Encounter (Signed)
What is the status of his [place]?

## 2016-10-31 NOTE — Telephone Encounter (Signed)
[place] from [employer], it states pt has been approved for [employer]. There is no delivery date on this paperwork and I have placed it in [facility] cart. It does say that it could take up to 6-8 weeks to finalize enrollment. I spoke with the pt and informed him. He is very appreciative of this and I advised him that when I know a delivery date, I will contact him about it. He is also aware that if for some reason it gets mailed to him, he needs to contact me and bring it to the office. It is checked on his paperwork to ship to the office but we are aware that mistakes can happen. Pt said his address has changed and I made sure it was correct in our system. He said he speaks with someone from [employer] weekly and they are aware of his address change. He is going to go by his old address and tell them if they receive anything for him to let him know.   Verlon AuLeslie, what instructions do you want him to have or does he need an ov?

## 2016-11-07 NOTE — Telephone Encounter (Signed)
I recommend office visit with me in 4-6 weeks. Hopefully by then we will have medication in hand or soon thereafter.   He will need to update a few labs before we can start the [place]. CBC, PT/INR, CMET. He can have those at least two weeks before his OV with me.

## 2016-11-08 ENCOUNTER — Other Ambulatory Visit: Payer: Self-pay

## 2016-11-08 ENCOUNTER — Other Ambulatory Visit: Payer: Self-pay | Admitting: Gastroenterology

## 2016-11-08 DIAGNOSIS — B182 Chronic viral hepatitis C: Secondary | ICD-10-CM

## 2016-11-08 NOTE — Telephone Encounter (Signed)
Tried to call pt- [facility] with recommendations. Labs done and mailed to the pt.   Please schedule ov.

## 2016-11-08 NOTE — Telephone Encounter (Signed)
Patient is scheduled to see LL 7/19 at 11:30 am  He is also knows that his lab orders have been placed in the mail and he should have them drawn on 7/5.

## 2016-11-29 LAB — CBC WITH DIFFERENTIAL/PLATELET
BASOS PCT: 0 %
Basophils Absolute: 0 cells/uL (ref 0–200)
EOS ABS: 171 {cells}/uL (ref 15–500)
Eosinophils Relative: 3 %
HEMATOCRIT: 44.1 % (ref 38.5–50.0)
Hemoglobin: 14.4 g/dL (ref 13.2–17.1)
LYMPHS PCT: 24 %
Lymphs Abs: 1368 cells/uL (ref 850–3900)
MCH: 29.9 pg (ref 27.0–33.0)
MCHC: 32.7 g/dL (ref 32.0–36.0)
MCV: 91.5 fL (ref 80.0–100.0)
MONO ABS: 570 {cells}/uL (ref 200–950)
MPV: 10.1 fL (ref 7.5–12.5)
Monocytes Relative: 10 %
NEUTROS ABS: 3591 {cells}/uL (ref 1500–7800)
Neutrophils Relative %: 63 %
Platelets: 191 10*3/uL (ref 140–400)
RBC: 4.82 MIL/uL (ref 4.20–5.80)
RDW: 14.1 % (ref 11.0–15.0)
WBC: 5.7 10*3/uL (ref 3.8–10.8)

## 2016-11-29 LAB — COMPREHENSIVE METABOLIC PANEL

## 2016-11-29 LAB — PROTIME-INR
INR: 1
Prothrombin Time: 10.5 s (ref 9.0–11.5)

## 2016-12-04 NOTE — Progress Notes (Signed)
I'm concerned the lab did not due the CMET. It was drawn five days again and just says pending. We need this before patient's upcoming OV. Also do we have [place] yet?

## 2016-12-07 NOTE — Progress Notes (Signed)
CMET was never done. Patient has OV with me this week. I will discuss with him and we will get him to go back to the lab.

## 2016-12-12 ENCOUNTER — Telehealth: Payer: Self-pay

## 2016-12-12 ENCOUNTER — Other Ambulatory Visit: Payer: Self-pay

## 2016-12-12 DIAGNOSIS — K7460 Unspecified cirrhosis of liver: Secondary | ICD-10-CM

## 2016-12-12 NOTE — Telephone Encounter (Signed)
Pt's mavyret came in the mail today. Do we just hold it until he comes in in Sept or do you want him to go ahead and start it. Please advise

## 2016-12-12 NOTE — Telephone Encounter (Signed)
talked with patient and he will have blood work done tomorrow

## 2016-12-12 NOTE — Telephone Encounter (Signed)
He has an appt to see me 01/03/17. We will give him the rx then. Unfortunately the lab did not do his CMET and I need him to have this done BEFORE his office visit. Please arrange. You will need to put in new order.

## 2016-12-12 NOTE — Telephone Encounter (Signed)
Lab order has been faxed.

## 2016-12-13 ENCOUNTER — Ambulatory Visit: Payer: Self-pay | Admitting: Gastroenterology

## 2016-12-26 LAB — COMPREHENSIVE METABOLIC PANEL
ALK PHOS: 52 U/L (ref 40–115)
ALT: 39 U/L (ref 9–46)
AST: 29 U/L (ref 10–40)
Albumin: 4.3 g/dL (ref 3.6–5.1)
BILIRUBIN TOTAL: 0.4 mg/dL (ref 0.2–1.2)
BUN: 12 mg/dL (ref 7–25)
CALCIUM: 8.6 mg/dL (ref 8.6–10.3)
CHLORIDE: 101 mmol/L (ref 98–110)
CO2: 26 mmol/L (ref 20–31)
Creat: 0.98 mg/dL (ref 0.60–1.35)
GLUCOSE: 90 mg/dL (ref 65–99)
POTASSIUM: 4.4 mmol/L (ref 3.5–5.3)
Sodium: 140 mmol/L (ref 135–146)
TOTAL PROTEIN: 6.2 g/dL (ref 6.1–8.1)

## 2016-12-26 NOTE — Progress Notes (Signed)
Normal LFTs. Further recommendations per Verlon AuLeslie at office visit upcoming 8/9.

## 2016-12-30 ENCOUNTER — Encounter (HOSPITAL_COMMUNITY)
Admission: EM | Disposition: A | Discharge status: Home / Self Care | Payer: Self-pay | Source: Home / Self Care | Attending: Emergency Medicine

## 2016-12-30 ENCOUNTER — Emergency Department (HOSPITAL_COMMUNITY): Payer: Self-pay | Admitting: Anesthesiology

## 2016-12-30 ENCOUNTER — Observation Stay (HOSPITAL_COMMUNITY)
Admission: EM | Admit: 2016-12-30 | Discharge: 2016-12-31 | Disposition: A | Discharge status: Home / Self Care | Payer: Self-pay | Attending: Surgery | Admitting: Surgery

## 2016-12-30 ENCOUNTER — Emergency Department (HOSPITAL_COMMUNITY): Payer: Self-pay

## 2016-12-30 ENCOUNTER — Encounter (HOSPITAL_COMMUNITY): Payer: Self-pay | Admitting: *Deleted

## 2016-12-30 DIAGNOSIS — Z79899 Other long term (current) drug therapy: Secondary | ICD-10-CM | POA: Insufficient documentation

## 2016-12-30 DIAGNOSIS — K353 Acute appendicitis with localized peritonitis, without perforation or gangrene: Secondary | ICD-10-CM

## 2016-12-30 DIAGNOSIS — K3580 Unspecified acute appendicitis: Secondary | ICD-10-CM | POA: Diagnosis present

## 2016-12-30 DIAGNOSIS — F17210 Nicotine dependence, cigarettes, uncomplicated: Secondary | ICD-10-CM | POA: Insufficient documentation

## 2016-12-30 HISTORY — DX: Unspecified viral hepatitis C without hepatic coma: B19.20

## 2016-12-30 HISTORY — PX: LAPAROSCOPIC APPENDECTOMY: SHX408

## 2016-12-30 LAB — URINALYSIS, ROUTINE W REFLEX MICROSCOPIC
BILIRUBIN URINE: NEGATIVE
GLUCOSE, UA: NEGATIVE mg/dL
HGB URINE DIPSTICK: NEGATIVE
Ketones, ur: NEGATIVE mg/dL
Leukocytes, UA: NEGATIVE
Nitrite: NEGATIVE
PH: 5 (ref 5.0–8.0)
Protein, ur: NEGATIVE mg/dL
SPECIFIC GRAVITY, URINE: 1.025 (ref 1.005–1.030)

## 2016-12-30 LAB — COMPREHENSIVE METABOLIC PANEL
ALBUMIN: 4.1 g/dL (ref 3.5–5.0)
ALT: 45 U/L (ref 17–63)
AST: 37 U/L (ref 15–41)
Alkaline Phosphatase: 44 U/L (ref 38–126)
Anion gap: 6 (ref 5–15)
BUN: 15 mg/dL (ref 6–20)
CHLORIDE: 107 mmol/L (ref 101–111)
CO2: 29 mmol/L (ref 22–32)
CREATININE: 0.98 mg/dL (ref 0.61–1.24)
Calcium: 8.9 mg/dL (ref 8.9–10.3)
GFR calc non Af Amer: >60 mL/min (ref >60)
GLUCOSE: 106 mg/dL — AB (ref 65–99)
Potassium: 4.1 mmol/L (ref 3.5–5.1)
SODIUM: 142 mmol/L (ref 135–145)
Total Bilirubin: 0.6 mg/dL (ref 0.3–1.2)
Total Protein: 6.7 g/dL (ref 6.5–8.1)

## 2016-12-30 LAB — CBC
HCT: 43.9 % (ref 39.0–52.0)
Hemoglobin: 15 g/dL (ref 13.0–17.0)
MCH: 30.5 pg (ref 26.0–34.0)
MCHC: 34.2 g/dL (ref 30.0–36.0)
MCV: 89.2 fL (ref 78.0–100.0)
Platelets: 167 10*3/uL (ref 150–400)
RBC: 4.92 MIL/uL (ref 4.22–5.81)
RDW: 13.5 % (ref 11.5–15.5)
WBC: 14.4 10*3/uL — ABNORMAL HIGH (ref 4.0–10.5)

## 2016-12-30 LAB — PROTIME-INR
INR: 0.94
PROTHROMBIN TIME: 12.6 s (ref 11.4–15.2)

## 2016-12-30 LAB — LIPASE, BLOOD: LIPASE: 35 U/L (ref 11–51)

## 2016-12-30 SURGERY — APPENDECTOMY, LAPAROSCOPIC
Anesthesia: General

## 2016-12-30 MED ORDER — GLYCOPYRROLATE 0.2 MG/ML IJ SOLN
INTRAMUSCULAR | Status: DC | PRN
  Administered 2016-12-30: .8 mg via INTRAVENOUS

## 2016-12-30 MED ORDER — NEOSTIGMINE METHYLSULFATE 10 MG/10ML IV SOLN
INTRAVENOUS | Status: DC | PRN
  Administered 2016-12-30: 1 mg via INTRAVENOUS
  Administered 2016-12-30: 5 mg via INTRAVENOUS

## 2016-12-30 MED ORDER — MIDAZOLAM HCL 2 MG/2ML IJ SOLN
INTRAMUSCULAR | Status: AC
  Filled 2016-12-30: qty 2

## 2016-12-30 MED ORDER — MIDAZOLAM HCL 2 MG/2ML IJ SOLN
INTRAMUSCULAR | Status: DC | PRN
  Administered 2016-12-30: 2 mg via INTRAVENOUS

## 2016-12-30 MED ORDER — SUCCINYLCHOLINE CHLORIDE 20 MG/ML IJ SOLN
INTRAMUSCULAR | Status: DC | PRN
  Administered 2016-12-30: 140 mg via INTRAVENOUS

## 2016-12-30 MED ORDER — GLYCOPYRROLATE 0.2 MG/ML IJ SOLN
INTRAMUSCULAR | Status: AC
  Filled 2016-12-30: qty 4

## 2016-12-30 MED ORDER — FENTANYL CITRATE (PF) 100 MCG/2ML IJ SOLN
INTRAMUSCULAR | Status: AC
  Filled 2016-12-30: qty 2

## 2016-12-30 MED ORDER — ACETAMINOPHEN 500 MG PO TABS
1000.0000 mg | ORAL_TABLET | Freq: Four times a day (QID) | ORAL | Status: DC
  Administered 2016-12-30 – 2016-12-31 (×3): 1000 mg via ORAL
  Filled 2016-12-30 (×3): qty 2

## 2016-12-30 MED ORDER — ONDANSETRON HCL 4 MG/2ML IJ SOLN
4.0000 mg | Freq: Once | INTRAMUSCULAR | Status: AC
  Administered 2016-12-30: 4 mg via INTRAVENOUS
  Filled 2016-12-30: qty 2

## 2016-12-30 MED ORDER — ONDANSETRON 4 MG PO TBDP: 4.0000 mg | ORAL_TABLET | Freq: Four times a day (QID) | ORAL | Status: DC | PRN

## 2016-12-30 MED ORDER — LIDOCAINE HCL (PF) 1 % IJ SOLN
INTRAMUSCULAR | Status: AC
  Filled 2016-12-30: qty 5

## 2016-12-30 MED ORDER — MORPHINE SULFATE (PF) 2 MG/ML IV SOLN
2.0000 mg | INTRAVENOUS | Status: DC | PRN
  Administered 2016-12-30 (×2): 2 mg via INTRAVENOUS
  Administered 2016-12-30: 4 mg via INTRAVENOUS
  Administered 2016-12-30: 2 mg via INTRAVENOUS
  Administered 2016-12-31: 4 mg via INTRAVENOUS
  Filled 2016-12-30 (×2): qty 2
  Filled 2016-12-30 (×3): qty 1

## 2016-12-30 MED ORDER — NEOSTIGMINE METHYLSULFATE 10 MG/10ML IV SOLN
INTRAVENOUS | Status: AC
  Filled 2016-12-30: qty 1

## 2016-12-30 MED ORDER — ENOXAPARIN SODIUM 40 MG/0.4ML ~~LOC~~ SOLN
40.0000 mg | SUBCUTANEOUS | Status: DC
  Filled 2016-12-30: qty 0.4

## 2016-12-30 MED ORDER — ROCURONIUM BROMIDE 100 MG/10ML IV SOLN
INTRAVENOUS | Status: DC | PRN
  Administered 2016-12-30: 35 mg via INTRAVENOUS
  Administered 2016-12-30: 5 mg via INTRAVENOUS

## 2016-12-30 MED ORDER — LACTATED RINGERS IV SOLN
INTRAVENOUS | Status: DC | PRN
  Administered 2016-12-30: 14:00:00 via INTRAVENOUS

## 2016-12-30 MED ORDER — BUPIVACAINE HCL (PF) 0.5 % IJ SOLN
INTRAMUSCULAR | Status: AC
  Filled 2016-12-30: qty 30

## 2016-12-30 MED ORDER — LIDOCAINE HCL (CARDIAC) 10 MG/ML IV SOLN
INTRAVENOUS | Status: DC | PRN
  Administered 2016-12-30: 50 mg via INTRAVENOUS

## 2016-12-30 MED ORDER — TRAMADOL HCL 50 MG PO TABS
50.0000 mg | ORAL_TABLET | Freq: Four times a day (QID) | ORAL | Status: DC | PRN
  Administered 2016-12-30 – 2016-12-31 (×2): 100 mg via ORAL
  Filled 2016-12-30 (×2): qty 2

## 2016-12-30 MED ORDER — PIPERACILLIN-TAZOBACTAM 3.375 G IVPB
3.3750 g | Freq: Three times a day (TID) | INTRAVENOUS | Status: DC
  Administered 2016-12-30 – 2016-12-31 (×2): 3.375 g via INTRAVENOUS
  Filled 2016-12-30 (×3): qty 50

## 2016-12-30 MED ORDER — BUPIVACAINE-EPINEPHRINE 0.25% -1:200000 IJ SOLN
INTRAMUSCULAR | Status: DC | PRN
Start: 2016-12-30 — End: 2016-12-30
  Administered 2016-12-30: 20 mL

## 2016-12-30 MED ORDER — SUCCINYLCHOLINE CHLORIDE 20 MG/ML IJ SOLN
INTRAMUSCULAR | Status: AC
  Filled 2016-12-30: qty 1

## 2016-12-30 MED ORDER — PIPERACILLIN-TAZOBACTAM 3.375 G IVPB: 3.3750 g | Freq: Three times a day (TID) | INTRAVENOUS | Status: DC

## 2016-12-30 MED ORDER — METHOCARBAMOL 500 MG PO TABS: 500.0000 mg | ORAL_TABLET | Freq: Four times a day (QID) | ORAL | Status: DC | PRN

## 2016-12-30 MED ORDER — MORPHINE SULFATE (PF) 4 MG/ML IV SOLN
4.0000 mg | Freq: Once | INTRAVENOUS | Status: AC
  Administered 2016-12-30: 4 mg via INTRAVENOUS
  Filled 2016-12-30: qty 1

## 2016-12-30 MED ORDER — ONDANSETRON HCL 4 MG/2ML IJ SOLN
INTRAMUSCULAR | Status: AC
  Filled 2016-12-30: qty 2

## 2016-12-30 MED ORDER — NICOTINE 21 MG/24HR TD PT24
21.0000 mg | MEDICATED_PATCH | Freq: Every day | TRANSDERMAL | Status: DC
  Administered 2016-12-30: 21 mg via TRANSDERMAL
  Filled 2016-12-30: qty 1

## 2016-12-30 MED ORDER — DIPHENHYDRAMINE HCL 25 MG PO CAPS: 25.0000 mg | ORAL_CAPSULE | Freq: Four times a day (QID) | ORAL | Status: DC | PRN

## 2016-12-30 MED ORDER — IOPAMIDOL (ISOVUE-300) INJECTION 61%
100.0000 mL | Freq: Once | INTRAVENOUS | Status: AC | PRN
  Administered 2016-12-30: 100 mL via INTRAVENOUS

## 2016-12-30 MED ORDER — ALPRAZOLAM 0.5 MG PO TABS
0.5000 mg | ORAL_TABLET | Freq: Three times a day (TID) | ORAL | Status: DC | PRN
  Administered 2016-12-30: 0.5 mg via ORAL
  Filled 2016-12-30: qty 1

## 2016-12-30 MED ORDER — SODIUM CHLORIDE 0.9 % IV SOLN
INTRAVENOUS | Status: DC
  Administered 2016-12-30: 16:00:00 via INTRAVENOUS

## 2016-12-30 MED ORDER — BUPIVACAINE-EPINEPHRINE (PF) 0.5% -1:200000 IJ SOLN
INTRAMUSCULAR | Status: AC
  Filled 2016-12-30: qty 30

## 2016-12-30 MED ORDER — PIPERACILLIN-TAZOBACTAM 3.375 G IVPB 30 MIN
3.3750 g | Freq: Once | INTRAVENOUS | Status: AC
  Administered 2016-12-30: 3.375 g via INTRAVENOUS
  Filled 2016-12-30: qty 50

## 2016-12-30 MED ORDER — ONDANSETRON HCL 4 MG/2ML IJ SOLN: 4.0000 mg | Freq: Four times a day (QID) | INTRAMUSCULAR | Status: DC | PRN

## 2016-12-30 MED ORDER — FENTANYL CITRATE (PF) 250 MCG/5ML IJ SOLN
INTRAMUSCULAR | Status: AC
  Filled 2016-12-30: qty 5

## 2016-12-30 MED ORDER — PROPOFOL 10 MG/ML IV BOLUS
INTRAVENOUS | Status: AC
  Filled 2016-12-30: qty 40

## 2016-12-30 MED ORDER — ROCURONIUM BROMIDE 50 MG/5ML IV SOLN
INTRAVENOUS | Status: AC
  Filled 2016-12-30: qty 1

## 2016-12-30 MED ORDER — FENTANYL CITRATE (PF) 100 MCG/2ML IJ SOLN
INTRAMUSCULAR | Status: DC | PRN
  Administered 2016-12-30 (×4): 50 ug via INTRAVENOUS
  Administered 2016-12-30: 100 ug via INTRAVENOUS
  Administered 2016-12-30 (×3): 50 ug via INTRAVENOUS

## 2016-12-30 MED ORDER — 0.9 % SODIUM CHLORIDE (POUR BTL) OPTIME
TOPICAL | Status: DC | PRN
  Administered 2016-12-30: 1000 mL

## 2016-12-30 MED ORDER — SODIUM CHLORIDE 0.9 % IV SOLN
INTRAVENOUS | Status: DC | PRN
  Administered 2016-12-30: 12:00:00 via INTRAVENOUS

## 2016-12-30 MED ORDER — DIPHENHYDRAMINE HCL 50 MG/ML IJ SOLN: 25.0000 mg | Freq: Four times a day (QID) | INTRAMUSCULAR | Status: DC | PRN

## 2016-12-30 MED ORDER — PROPOFOL 10 MG/ML IV BOLUS
INTRAVENOUS | Status: DC | PRN
  Administered 2016-12-30: 50 mg via INTRAVENOUS
  Administered 2016-12-30: 200 mg via INTRAVENOUS

## 2016-12-30 MED ORDER — ONDANSETRON HCL 4 MG/2ML IJ SOLN
INTRAMUSCULAR | Status: DC | PRN
  Administered 2016-12-30: 4 mg via INTRAVENOUS

## 2016-12-30 SURGICAL SUPPLY — 53 items
APPLIER CLIP ROT 10 11.4 M/L (STAPLE)
BAG HAMPER (MISCELLANEOUS) ×3 IMPLANT
BENZOIN TINCTURE PRP APPL 2/3 (GAUZE/BANDAGES/DRESSINGS) ×3 IMPLANT
CHLORAPREP W/TINT 26ML (MISCELLANEOUS) ×3 IMPLANT
CLIP APPLIE ROT 10 11.4 M/L (STAPLE) IMPLANT
CLOSURE WOUND 1/2 X4 (GAUZE/BANDAGES/DRESSINGS) ×1
CLOTH BEACON ORANGE TIMEOUT ST (SAFETY) ×3 IMPLANT
COVER SURGICAL LIGHT HANDLE (MISCELLANEOUS) ×3 IMPLANT
CUTTER FLEX LINEAR 45M (STAPLE) ×3 IMPLANT
DECANTER SPIKE VIAL GLASS SM (MISCELLANEOUS) ×3 IMPLANT
DRAPE LAPAROSCOPIC ABDOMINAL (DRAPES) IMPLANT
DRSG TEGADERM 2-3/8X2-3/4 SM (GAUZE/BANDAGES/DRESSINGS) ×6 IMPLANT
DRSG TEGADERM 2.38X2.75 (GAUZE/BANDAGES/DRESSINGS) ×6 IMPLANT
DRSG TEGADERM 4X4.75 (GAUZE/BANDAGES/DRESSINGS) ×3 IMPLANT
ELECT REM PT RETURN 9FT ADLT (ELECTROSURGICAL) ×3
ELECTRODE REM PT RTRN 9FT ADLT (ELECTROSURGICAL) ×1 IMPLANT
ENDOLOOP SUT PDS II  0 18 (SUTURE)
ENDOLOOP SUT PDS II 0 18 (SUTURE) IMPLANT
FILTER SMOKE EVAC LAPAROSHD (FILTER) ×3 IMPLANT
FORMALIN 10 PREFIL 120ML (MISCELLANEOUS) ×3 IMPLANT
GAUZE SPONGE 2X2 8PLY STRL LF (GAUZE/BANDAGES/DRESSINGS) ×3 IMPLANT
GLOVE BIO SURGEON STRL SZ7 (GLOVE) ×3 IMPLANT
GLOVE BIOGEL M 6.5 STRL (GLOVE) ×3 IMPLANT
GLOVE BIOGEL PI IND STRL 6.5 (GLOVE) ×1 IMPLANT
GLOVE BIOGEL PI IND STRL 7.5 (GLOVE) ×2 IMPLANT
GLOVE BIOGEL PI INDICATOR 6.5 (GLOVE) ×2
GLOVE BIOGEL PI INDICATOR 7.5 (GLOVE) ×4
GOWN STRL REUS W/ TWL LRG LVL3 (GOWN DISPOSABLE) ×2 IMPLANT
GOWN STRL REUS W/TWL LRG LVL3 (GOWN DISPOSABLE) ×4
INST SET LAPROSCOPIC AP (KITS) ×3 IMPLANT
KIT ROOM TURNOVER APOR (KITS) ×3 IMPLANT
MANIFOLD NEPTUNE II (INSTRUMENTS) IMPLANT
NEEDLE INSUFFLATION 14GA 120MM (NEEDLE) ×3 IMPLANT
NS IRRIG 1000ML POUR BTL (IV SOLUTION) ×3 IMPLANT
PAD ARMBOARD 7.5X6 YLW CONV (MISCELLANEOUS) ×3 IMPLANT
POUCH SPECIMEN RETRIEVAL 10MM (ENDOMECHANICALS) ×3 IMPLANT
RELOAD STAPLE TA45 3.5 REG BLU (ENDOMECHANICALS) ×3 IMPLANT
SET BASIN LINEN APH (SET/KITS/TRAYS/PACK) ×3 IMPLANT
SET IRRIG TUBING LAPAROSCOPIC (IRRIGATION / IRRIGATOR) IMPLANT
SHEARS HARMONIC ACE PLUS 36CM (ENDOMECHANICALS) ×3 IMPLANT
SLEEVE ENDOPATH XCEL 5M (ENDOMECHANICALS) ×3 IMPLANT
SPONGE GAUZE 2X2 8PLY STER LF (GAUZE/BANDAGES/DRESSINGS) ×3
SPONGE GAUZE 2X2 8PLY STRL LF (GAUZE/BANDAGES/DRESSINGS) ×6 IMPLANT
SPONGE GAUZE 2X2 STER 10/PKG (GAUZE/BANDAGES/DRESSINGS) ×6
STRIP CLOSURE SKIN 1/2X4 (GAUZE/BANDAGES/DRESSINGS) ×2 IMPLANT
SUT MNCRL AB 4-0 PS2 18 (SUTURE) ×3 IMPLANT
SUT MON AB 4-0 RB1 27 (SUTURE) ×3 IMPLANT
SUT VICRYL 0 UR6 27IN ABS (SUTURE) ×3 IMPLANT
TROCAR BLADELESS OPT 12M 100M (ENDOMECHANICALS) ×3 IMPLANT
TROCAR XCEL BLUNT TIP 100MML (ENDOMECHANICALS) IMPLANT
TROCAR XCEL NON-BLD 5MMX100MML (ENDOMECHANICALS) ×6 IMPLANT
TUBING INSUFFLATION (TUBING) ×3 IMPLANT
WARMER LAPAROSCOPE (MISCELLANEOUS) ×3 IMPLANT

## 2016-12-30 NOTE — ED Triage Notes (Signed)
Pt woke up at 0400 this morning with sharp RLQ pain along with nausea. Denies vomiting, diarrhea, fever.

## 2016-12-30 NOTE — ED Notes (Signed)
Report received 

## 2016-12-30 NOTE — Progress Notes (Signed)
Received a verbal order for a 21 mg nicotine patch for patient from Dr. Judie PetitM. Corliss Skainssuei.

## 2016-12-30 NOTE — Anesthesia Postprocedure Evaluation (Signed)
Anesthesia Post Note  Patient: Joseph QuintonJustin Hines  Procedure(s) Performed: Procedure(s) (LRB): APPENDECTOMY LAPAROSCOPIC (N/A)  Patient location during evaluation: Nursing Unit Anesthesia Type: General Level of consciousness: awake and alert Pain management: satisfactory to patient Vital Signs Assessment: post-procedure vital signs reviewed and stable Respiratory status: spontaneous breathing Cardiovascular status: stable Postop Assessment: no signs of nausea or vomiting Anesthetic complications: no     Last Vitals:  Vitals:   12/30/16 1445 12/30/16 1500  BP: 128/83 125/84  Pulse: (!) 104 82  Resp: 14 11  Temp:      Last Pain:  Vitals:   12/30/16 1505  TempSrc:   PainSc: 2                  Abou Sterkel

## 2016-12-30 NOTE — ED Notes (Signed)
Surgeon is at the bedside 

## 2016-12-30 NOTE — ED Notes (Signed)
Second nurse attempting IV  

## 2016-12-30 NOTE — ED Provider Notes (Addendum)
[facility] DE[facility] Provider Note   CSN: [id number] Arrival date & time: 12/30/16  [id number]     History   Chief Complaint Chief [id number]  Patient presents with  . Abdominal Pain    HPI Joseph Hines is a 31 y.o. male with a history significant for Hepatitis C with etoh and polysubstance abuse presenting with subjectively sudden onset abdominal pain which woke him from sleep at 4 am.  He described right lower quadrant pain which is sharp, constant and with a pressure sensation and worsened with movement and when lying on either side.  He reports feeling episodes of flushing along with constant nausea without emesis.  He had a normal bm this am shortly after he woke with this pain which did not alter his symptoms.  Pain radiates to his right lower back, not to his scrotum and he denies dysuria or hematuria.  He has had no treatment prior to arrival.   HPI  Past Medical History:  Diagnosis Date  . Hepatitis C   . Traumatic brain injury [facility](HCC) age 31    Patient Active Problem List   Diagnosis Date Noted  . Hepatitis C virus infection without hepatic coma 04/04/2016  . Alcohol use disorder, severe, dependence (HCC) 09/12/2015  . Opioid use disorder, moderate, dependence (HCC) 09/12/2015  . Tobacco use disorder 09/12/2015  . 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 Cocaine use disorder, severe, dependence (HCC) 09/11/2015  . Severe benzodiazepine use disorder (HCC) 09/11/2015  . Amphetamine use disorder, severe, dependence (HCC) 09/11/2015    Past Surgical History:  Procedure Laterality Date  . FEMUR FRACTURE SURGERY Right age 31  . LEG SURGERY     right   . TONSILLECTOMY         Home Medications    Prior to Admission medications   Medication Sig Start Date End Date Taking? Authorizing Provider  doxycycline (VIBRAMYCIN) 100 MG capsule Take 1 capsule (100 mg total) by mouth 2 (two) times daily. 10/01/16   Vanetta MuldersZackowski,  Scott, MD  HYDROcodone-acetaminophen (NORCO/VICODIN) 5-325 MG tablet Take 1-2 tablets by mouth every 6 (six) hours as needed. 10/01/16   Vanetta MuldersZackowski, Scott, MD  naproxen (NAPROSYN) 500 MG tablet Take 1 tablet (500 mg total) by mouth 2 (two) times daily. 10/01/16   Vanetta MuldersZackowski, Scott, MD    Family History Family History  Problem Relation Age of Onset  . Breast cancer Mother   . Liver disease Neg Hx     Social History Social History  Substance Use Topics  . Smoking status: Current Every Day Smoker    Packs/day: 1.00    Years: 13.00    Types: Cigarettes  . Smokeless tobacco: Never Used  . Alcohol use No     Comment: hx alcoholism. sober since 09-08-15     Allergies   Patient has no known allergies.   Review of Systems Review of Systems  Constitutional: Positive for fever.  HENT: Negative.   Eyes: Negative.   Respiratory: Negative for shortness of breath.   Cardiovascular: Negative for chest pain.  Gastrointestinal: Positive for abdominal pain and nausea. Negative for vomiting.  Genitourinary: Negative.   Musculoskeletal: Negative for arthralgias, joint swelling and neck pain.  Skin: Negative.  Negative for rash and wound.  Neurological: Negative for dizziness and light-headedness.  Psychiatric/Behavioral: Negative.      Physical Exam Updated Vital Signs BP 113/78   Pulse 80   Temp 98 F (36.7 C) (Oral)   Resp 18  SpO2 97%   Physical Exam  Constitutional: He appears well-developed and well-nourished.  HENT:  Head: Normocephalic and atraumatic.  Eyes: Conjunctivae are normal.  Neck: Normal range of motion.  Cardiovascular: Normal rate, regular rhythm, normal heart sounds and intact distal pulses.   Pulmonary/Chest: Effort normal and breath sounds normal. He has no wheezes.  Abdominal: Soft. He exhibits no distension, no fluid wave and no mass. Bowel sounds are decreased. There is tenderness in the right lower quadrant. There is no rebound, no guarding, no CVA  tenderness and no tenderness at McBurney's point.  Musculoskeletal: Normal range of motion.  Neurological: He is alert.  Skin: Skin is warm and dry.  Psychiatric: He has a normal mood and affect.  Nursing note and vitals reviewed.    ED Treatments / Results  Labs (all labs ordered are listed, but only abnormal results are displayed) Labs Reviewed  COMPREHENSIVE METABOLIC PANEL - Abnormal; Notable for the following:       Result Value   Glucose, Bld 106 (*)    All other components within normal limits  CBC - Abnormal; Notable for the following:    WBC 14.4 (*)    All other components within normal limits  LIPASE, BLOOD  URINALYSIS, ROUTINE W REFLEX MICROSCOPIC  PROTIME-INR    EKG  EKG Interpretation None       Radiology Ct Abdomen Pelvis W Contrast  Result Date: 12/30/2016 CLINICAL DATA:  Acute onset right lower quadrant pain and nausea this morning. EXAM: CT ABDOMEN AND PELVIS WITH CONTRAST TECHNIQUE: Multidetector CT imaging of the abdomen and pelvis was performed using the standard protocol following bolus administration of intravenous contrast. CONTRAST:  ISOVUE-300 IOPAMIDOL (ISOVUE-300) INJECTION 61% COMPARISON:  06/20/2016 and 07/23/2004 FINDINGS: Lower Chest: No acute findings. Hepatobiliary: No hepatic masses identified. Gallbladder is unremarkable. Pancreas:  No mass or inflammatory changes. Spleen: Within normal limits in size and appearance. Adrenals/Urinary Tract: No masses identified. Tiny bilateral renal cysts. No evidence of hydronephrosis. Stomach/Bowel: The enlarged appendix seen measuring 9 mm with mild periappendiceal inflammatory changes. This is consistent with acute appendicitis. No evidence of abscess, bowel obstruction, or other complication Vascular/Lymphatic: No pathologically enlarged lymph nodes. Splenic artery embolization coils noted. No abdominal aortic aneurysm. Reproductive:  No mass or other significant abnormality. Other:  None.  Musculoskeletal: No suspicious bone lesions identified. Right femoral intramedullary rod. IMPRESSION: Positive for acute appendicitis. No evidence of abscess or other complication. Electronically Signed   By: Myles Rosenthal M.D.   On: 12/30/2016 09:44    Procedures Procedures (including critical care time)  Medications Ordered in ED Medications  piperacillin-tazobactam (ZOSYN) IVPB 3.375 g (not administered)  ondansetron (ZOFRAN) injection 4 mg (4 mg Intravenous Given 12/30/16 0852)  morphine 4 MG/ML injection 4 mg (4 mg Intravenous Given 12/30/16 0852)  iopamidol (ISOVUE-300) 61 % injection 100 mL (100 mLs Intravenous Contrast Given 12/30/16 0909)     Initial Impression / Assessment and Plan / ED Course  I have reviewed the triage vital signs and the nursing notes.  Pertinent labs & imaging results that were available during my care of the patient were reviewed by me and considered in my medical decision making (see chart for details).     Discussed with Dr Harlon Flor who will take to OR here early pm.  NPO, abx ordered. Pt was given zofran/ morphine and is comfortable at this time.   Final Clinical Impressions(s) / ED Diagnoses   Final diagnoses:  Acute appendicitis with localized peritonitis  New Prescriptions New Prescriptions   No medications on file     Victoriano Laindol, Julie, PA-C 12/30/16 1009    Margarita Grizzleay, Kwaku Mostafa, MD 01/03/17 1225 31 y.o. Ow healthy many presents complaining of adominal pain that began last night and worsened today.  Patient with rlq ttp and ct positive for appendicitis.  Dr. Corliss Skainssuei in and took patient to or.   I performed a history and physical examination of Faylene MillionFelicia N Bell and discussed her management with Ms. Idol .  I agree with the history, physical, assessment, and plan of care, with the following exceptions: None  I was present for the following procedures: None Time Spent in Critical Care of the patient: None Time spent in discussions with the patient and family:  10  Makinsey Pepitone Hilario QuarryS    Shamari Lofquist, MD 01/03/17 1228

## 2016-12-30 NOTE — Progress Notes (Signed)
Received verbal order for PO xanax 0.5 mg Q8hrs PRN from Dr. Gwyndolyn KaufmanM Tsuei.

## 2016-12-30 NOTE — Progress Notes (Signed)
Pharmacy Antibiotic Note  Joseph Hines is a 31 y.o. male admitted on 12/30/2016 with abdominal pain.  Pharmacy has been consulted for zosyn dosing. Initial dose ordered in the ED  Plan: Zosyn 3.375g IV q8h (4 hour infusion).  F/u cultures and clinical course   Temp (24hrs), Avg:98 F (36.7 C), Min:98 F (36.7 C), Max:98 F (36.7 C)   Recent Labs Lab 12/25/16 1640 12/30/16 0810  WBC  --  14.4*  CREATININE 0.98 0.98    CrCl cannot be calculated (Unknown ideal weight.).    No Known Allergies   Thank you for allowing pharmacy to be a part of this patient's care.  Talbert CageSeay, Lindey Renzulli Poteet 12/30/2016 10:21 AM

## 2016-12-30 NOTE — Addendum Note (Signed)
Addendum  created 12/30/16 1601 by Franco NonesYates, Kimora Stankovic S, CRNA   Anesthesia Event edited, Anesthesia Intra Flowsheets edited, Anesthesia Intra Meds edited, Anesthesia Staff edited

## 2016-12-30 NOTE — ED Notes (Signed)
Rosey Batheresa, CRNA in to see pt

## 2016-12-30 NOTE — Transfer of Care (Signed)
Immediate Anesthesia Transfer of Care Note  Patient: Joseph Hines  Procedure(s) Performed: Procedure(s): APPENDECTOMY LAPAROSCOPIC (N/A)  Patient Location: PACU  Anesthesia Type:General  Level of Consciousness: awake and patient cooperative  Airway & Oxygen Therapy: Patient Spontanous Breathing and non-rebreather face mask  Post-op Assessment: Report given to RN and Post -op Vital signs reviewed and stable  Post vital signs: Reviewed and stable  Last Vitals:  Vitals:   12/30/16 1100 12/30/16 1239  BP: 122/87 126/80  Pulse:  71  Resp:  18  Temp:      Last Pain:  Vitals:   12/30/16 1051  TempSrc:   PainSc: 6          Complications: No apparent anesthesia complications

## 2016-12-30 NOTE — ED Notes (Signed)
Mother at bedside.

## 2016-12-30 NOTE — Anesthesia Preprocedure Evaluation (Signed)
Anesthesia Evaluation  Patient identified by MRN, date of birth, ID band Patient awake    Reviewed: Allergy & Precautions, NPO status , Patient's Chart, lab work & pertinent test results, Unable to perform ROS - Chart review only  Airway Mallampati: II  TM Distance: >3 FB Neck ROM: Full    Dental  (+) Teeth Intact   Pulmonary Current Smoker,    breath sounds clear to auscultation       Cardiovascular  Rhythm:Regular     Neuro/Psych Patient denies anxiety or depression   GI/Hepatic (+) Hepatitis -, C  Endo/Other    Renal/GU      Musculoskeletal   Abdominal   Peds  Hematology   Anesthesia Other Findings   Reproductive/Obstetrics                             Anesthesia Physical Anesthesia Plan  ASA: II and emergent  Anesthesia Plan: General   Post-op Pain Management:    Induction: Rapid sequence, Cricoid pressure planned and Intravenous  PONV Risk Score and Plan:   Airway Management Planned: Oral ETT  Additional Equipment:   Intra-op Plan:   Post-operative Plan: Extubation in OR  Informed Consent:   Plan Discussed with: Anesthesiologist and Surgeon  Anesthesia Plan Comments:         Anesthesia Quick Evaluation

## 2016-12-30 NOTE — Op Note (Signed)
Appendectomy, Lap, Procedure Note  Indications: The patient presented with a history of right-sided abdominal pain. A CT scan revealed findings consistent with acute appendicitis.  There was no sign of perforation.  Pre-operative Diagnosis: Acute appendicitis without mention of peritonitis  Post-operative Diagnosis: Same  Surgeon: Keval Nam K.   Assistants: none  Anesthesia: General endotracheal anesthesia  ASA Class: 2E  Procedure Details  The patient was seen again in the Holding Room. The risks, benefits, complications, treatment options, and expected outcomes were discussed with the patient and/or family. The possibilities of reaction to medication, perforation of viscus, bleeding, recurrent infection, finding a normal appendix, the need for additional procedures, failure to diagnose a condition, and creating a complication requiring transfusion or operation were discussed. There was concurrence with the proposed plan and informed consent was obtained. The site of surgery was properly noted. The patient was taken to Operating Room, identified as Nanda QuintonJustin Haxton and the procedure verified as Appendectomy. A Time Out was held and the above information confirmed.  The patient was placed in the supine position and general anesthesia was induced.  The abdomen was prepped and draped in a sterile fashion. A one centimeter supraumbilical incision was made.  Dissection was carried down to the fascia bluntly.  The fascia was incised vertically.  We entered the peritoneal cavity bluntly.  A pursestring suture was passed around the incision with a 0 Vicryl.  The Hasson cannula was introduced into the abdomen and the tails of the suture were used to hold the Hasson in place.   The pneumoperitoneum was then established maintaining a maximum pressure of 15 mmHg.  Additional 5 mm cannulas then placed in the left lower quadrant of the abdomen and the right upper quadrant under direct visualization. A  careful evaluation of the entire abdomen was carried out. The patient was placed in Trendelenburg and left lateral decubitus position.  The scope was moved to the right upper quadrant port site. The cecum was mobilized medially.  The appendix was identified laterally.  There was no sign of perforation.  The appendix was carefully dissected. The appendix was was skeletonized with the harmonic scalpel.   The appendix was divided at its base using an endo-GIA stapler. Minimal appendiceal stump was left in place. There was no evidence of bleeding, leakage, or complication after division of the appendix.  The umbilical port site was closed with the purse string suture. There was no residual palpable fascial defect.  The trocar site skin wounds were closed with 4-0 Monocryl.  Instrument, sponge, and needle counts were correct at the conclusion of the case.   Findings: The appendix was found to be inflamed. There were not signs of necrosis.  There was not perforation. There was not abscess formation.  Estimated Blood Loss:  Minimal         Drains: none         Specimens: appendix         Complications:  None; patient tolerated the procedure well.         Disposition: PACU - hemodynamically stable.         Condition: stable  Wilmon ArmsMatthew K. Corliss Skainssuei, MD, [facility]  General/ Trauma Surgery  12/30/2016 2:28 PM

## 2016-12-30 NOTE — Anesthesia Procedure Notes (Signed)
Procedure Name: Intubation Date/Time: 12/30/2016 1:35 PM Performed by: Franco NonesYATES, Krystall Kruckenberg S Pre-anesthesia Checklist: Patient identified, Patient being monitored, Timeout performed, Emergency Drugs available and Suction available Patient Re-evaluated:Patient Re-evaluated prior to induction Oxygen Delivery Method: Circle System Utilized Preoxygenation: Pre-oxygenation with 100% oxygen Induction Type: IV induction, Rapid sequence and Cricoid Pressure applied Laryngoscope Size: Miller and 2 Grade View: Grade I Tube type: Oral Tube size: 7.0 mm Number of attempts: 1 Airway Equipment and Method: Stylet and Oral airway Placement Confirmation: ETT inserted through vocal cords under direct vision,  positive ETCO2 and breath sounds checked- equal and bilateral Secured at: 21 cm Tube secured with: Tape Dental Injury: Teeth and Oropharynx as per pre-operative assessment

## 2016-12-30 NOTE — H&P (Signed)
Joseph Hines is an 31 y.o. male.   Chief Complaint: RLQ abdominal pain HPI: 31 yo male with Hep C presents with half a day of periumbilical pain that has migrated to the RLQ.  The pain worsened so he presented to the ED for evaluation.  No fever, mild nausea, no vomiting or diarrhea.  CT scan showed early appendicitis.    Past Medical History:  Diagnosis Date  . Hepatitis C   . Traumatic brain injury [facility]) age 55    Past Surgical History:  Procedure Laterality Date  . FEMUR FRACTURE SURGERY Right age 60  . LEG SURGERY     right   . TONSILLECTOMY      Family History  Problem Relation Age of Onset  . Breast cancer Mother   . Liver disease Neg Hx    Social History:  reports that he has been smoking Cigarettes.  He has a 13.00 pack-year smoking history. He has never used smokeless tobacco. He reports that he does not drink alcohol or use drugs.  Allergies: No Known Allergies  No chronic meds  Results for orders placed or performed during the hospital encounter of 12/30/16 (from the past 48 hour(s))  Urinalysis, Routine w reflex microscopic     Status: None   Collection Time: 12/30/16  8:01 AM  Result Value Ref Range   Color, Urine YELLOW YELLOW   APPearance CLEAR CLEAR   Specific Gravity, Urine 1.025 1.005 - 1.030   pH 5.0 5.0 - 8.0   Glucose, UA NEGATIVE NEGATIVE mg/dL   Hgb urine dipstick NEGATIVE NEGATIVE   Bilirubin Urine NEGATIVE NEGATIVE   Ketones, ur NEGATIVE NEGATIVE mg/dL   Protein, ur NEGATIVE NEGATIVE mg/dL   Nitrite NEGATIVE NEGATIVE   Leukocytes, UA NEGATIVE NEGATIVE  Lipase, blood     Status: None   Collection Time: 12/30/16  8:10 AM  Result Value Ref Range   Lipase 35 11 - 51 U/L  Comprehensive metabolic panel     Status: Abnormal   Collection Time: 12/30/16  8:10 AM  Result Value Ref Range   Sodium 142 135 - 145 mmol/L   Potassium 4.1 3.5 - 5.1 mmol/L   Chloride 107 101 - 111 mmol/L   CO2 29 22 - 32 mmol/L   Glucose, Bld 106 (H) 65 - 99 mg/dL   BUN  15 6 - 20 mg/dL   Creatinine, Ser 0.98 0.61 - 1.24 mg/dL   Calcium 8.9 8.9 - 10.3 mg/dL   Total Protein 6.7 6.5 - 8.1 g/dL   Albumin 4.1 3.5 - 5.0 g/dL   AST 37 15 - 41 U/L   ALT 45 17 - 63 U/L   Alkaline Phosphatase 44 38 - 126 U/L   Total Bilirubin 0.6 0.3 - 1.2 mg/dL   GFR calc non Af Amer >60 >60 mL/min   GFR calc Af Amer >60 >60 mL/min    Comment: (NOTE) The eGFR has been calculated using the CKD EPI equation. This calculation has not been validated in all clinical situations. eGFR's persistently <60 mL/min signify possible Chronic Kidney Disease.    Anion gap 6 5 - 15  CBC     Status: Abnormal   Collection Time: 12/30/16  8:10 AM  Result Value Ref Range   WBC 14.4 (H) 4.0 - 10.5 K/uL   RBC 4.92 4.22 - 5.81 MIL/uL   Hemoglobin 15.0 13.0 - 17.0 g/dL   HCT 43.9 39.0 - 52.0 %   MCV 89.2 78.0 - 100.0 fL  MCH 30.5 26.0 - 34.0 pg   MCHC 34.2 30.0 - 36.0 g/dL   RDW 13.5 11.5 - 15.5 %   Platelets 167 150 - 400 K/uL  Protime-INR     Status: None   Collection Time: 12/30/16  8:10 AM  Result Value Ref Range   Prothrombin Time 12.6 11.4 - 15.2 seconds   INR 0.94    Ct Abdomen Pelvis W Contrast  Result Date: 12/30/2016 CLINICAL DATA:  Acute onset right lower quadrant pain and nausea this morning. EXAM: CT ABDOMEN AND PELVIS WITH CONTRAST TECHNIQUE: Multidetector CT imaging of the abdomen and pelvis was performed using the standard protocol following bolus administration of intravenous contrast. CONTRAST:  158m ISOVUE-300 IOPAMIDOL (ISOVUE-300) INJECTION 61% COMPARISON:  06/20/2016 and 07/23/2004 FINDINGS: Lower Chest: No acute findings. Hepatobiliary: No hepatic masses identified. Gallbladder is unremarkable. Pancreas:  No mass or inflammatory changes. Spleen: Within normal limits in size and appearance. Adrenals/Urinary Tract: No masses identified. Tiny bilateral renal cysts. No evidence of hydronephrosis. Stomach/Bowel: The enlarged appendix seen measuring 9 mm with mild  periappendiceal inflammatory changes. This is consistent with acute appendicitis. No evidence of abscess, bowel obstruction, or other complication Vascular/Lymphatic: No pathologically enlarged lymph nodes. Splenic artery embolization coils noted. No abdominal aortic aneurysm. Reproductive:  No mass or other significant abnormality. Other:  None. Musculoskeletal: No suspicious bone lesions identified. Right femoral intramedullary rod. IMPRESSION: Positive for acute appendicitis. No evidence of abscess or other complication. Electronically Signed   By: JEarle GellM.D.   On: 12/30/2016 09:44    Review of Systems  Constitutional: Negative for weight loss.  HENT: Negative for ear discharge, ear pain, hearing loss and tinnitus.   Eyes: Negative for blurred vision, double vision, photophobia and pain.  Respiratory: Negative for cough, sputum production and shortness of breath.   Cardiovascular: Negative for chest pain.  Gastrointestinal: Positive for abdominal pain and nausea. Negative for vomiting.  Genitourinary: Negative for dysuria, flank pain, frequency and urgency.  Musculoskeletal: Negative for back pain, falls, joint pain, myalgias and neck pain.  Neurological: Negative for dizziness, tingling, sensory change, focal weakness, loss of consciousness and headaches.  Endo/Heme/Allergies: Does not bruise/bleed easily.  Psychiatric/Behavioral: Negative for depression, memory loss and substance abuse. The patient is not nervous/anxious.     Blood pressure 126/80, pulse 71, temperature 98 F (36.7 C), temperature source Oral, resp. rate 18, SpO2 99 %. Physical Exam  WDWN in NAD Eyes:  Pupils equal, round; sclera anicteric HENT:  Oral mucosa moist; good dentition  Neck:  No masses palpated, no thyromegaly Lungs:  CTA bilaterally; normal respiratory effort CV:  Regular rate and rhythm; no murmurs; extremities well-perfused with no edema Abd:  +bowel sounds, soft, tender in RLQ; no palpable masses;  no peritonitis Skin:  Warm, dry; no sign of jaundice Psychiatric - alert and oriented x 4; calm mood and affect  Assessment/Plan Acute appendicitis  To OR for laparoscopic appendectomy.  The surgical procedure has been discussed with the patient.  Potential risks, benefits, alternative treatments, and expected outcomes have been explained.  All of the patient's questions at this time have been answered.  The likelihood of reaching the patient's treatment goal is good.  The patient understand the proposed surgical procedure and wishes to proceed.   TMaia Petties, MD 12/30/2016, 1:16 PM

## 2016-12-30 NOTE — ED Notes (Signed)
Awaiting surgeon

## 2016-12-31 ENCOUNTER — Telehealth: Payer: Self-pay | Admitting: Internal Medicine

## 2016-12-31 ENCOUNTER — Encounter (HOSPITAL_COMMUNITY): Payer: Self-pay | Admitting: Surgery

## 2016-12-31 LAB — BASIC METABOLIC PANEL
Anion gap: 6 (ref 5–15)
BUN: 10 mg/dL (ref 6–20)
CHLORIDE: 105 mmol/L (ref 101–111)
CO2: 28 mmol/L (ref 22–32)
CREATININE: 0.83 mg/dL (ref 0.61–1.24)
Calcium: 8 mg/dL — ABNORMAL LOW (ref 8.9–10.3)
GFR calc non Af Amer: >60 mL/min (ref >60)
Glucose, Bld: 121 mg/dL — ABNORMAL HIGH (ref 65–99)
POTASSIUM: 3.8 mmol/L (ref 3.5–5.1)
SODIUM: 139 mmol/L (ref 135–145)

## 2016-12-31 LAB — CBC
HEMATOCRIT: 40.5 % (ref 39.0–52.0)
Hemoglobin: 13.5 g/dL (ref 13.0–17.0)
MCH: 30.2 pg (ref 26.0–34.0)
MCHC: 33.3 g/dL (ref 30.0–36.0)
MCV: 90.6 fL (ref 78.0–100.0)
Platelets: 141 10*3/uL — ABNORMAL LOW (ref 150–400)
RBC: 4.47 MIL/uL (ref 4.22–5.81)
RDW: 13.8 % (ref 11.5–15.5)
WBC: 6.5 10*3/uL (ref 4.0–10.5)

## 2016-12-31 MED ORDER — TRAMADOL HCL 50 MG PO TABS: 50.0000 mg | ORAL_TABLET | Freq: Four times a day (QID) | ORAL | 0 refills | Status: DC | PRN

## 2016-12-31 NOTE — Addendum Note (Signed)
Addendum  created 12/31/16 0755 by Moshe Salisburyaniel, Destinie Thornsberry E, CRNA   Sign clinical note

## 2016-12-31 NOTE — Care Management Note (Signed)
Case Management Note  Patient Details  Name: Nanda QuintonJustin Porath MRN: [id number] Date of Birth: 06/21/1985  Subjective/Objective:                  Admitted with appendicitis.  Chart reviewed for CM needs. Pt from home, uninsured, has PCP and transportation. Pt discharging home today. [facility] due to it being narcotic. Pt has hospital f/u.   Action/Plan: DC home today. No CM needs identified prior to DC.  Expected Discharge Date:  12/31/16               Expected Discharge Plan:  Home/Self Care  In-House Referral:  NA  Discharge planning Services  CM Consult  Post Acute Care Choice:  NA Choice offered to:  NA  Status of Service:  Completed, signed off   Malcolm MetroChildress, Derik Fults Demske, RN 12/31/2016, 11:30 AM

## 2016-12-31 NOTE — Telephone Encounter (Signed)
The Hep C medication for this patient will be delivered to the office on Thursday.

## 2016-12-31 NOTE — Progress Notes (Signed)
Patient's IV removed. Site WNL.  AVS reviewed with patient.  Verbalized understanding of discharge instructions, physician follow-up, medications.  Prescription for pain medication given to patient at discharge.  Patient asked about a work note.  Dr. Earlene Plateravis called by RN.  Dr. Earlene Plateravis stated patient may use his discharge paperwork and that patient can return to work if does not lift over 20 lbs and if he does not drive while taking narcotics.  Patient educated on this and verbalized understanding.  Patient ambulated to entrance for discharge.  Patient stable at time of discharge.

## 2016-12-31 NOTE — Discharge Instructions (Signed)
In addition to included general post-operative instructions for Laparoscopic Appendectomy,  Diet: Resume home heart healthy diet.   Activity: No heavy lifting >20 pounds (children, pets, laundry, garbage) or strenuous activity until follow-up, but light activity and walking are encouraged. Do not drive or drink alcohol if taking narcotic pain medications.  Wound care: Remove outer adhesive dressings tomorrow (Tuesday, 8/7, 2 days after surgery), after which you may shower/get incision wet with soapy water and pat dry (do not rub incisions), but no baths or submerging incision underwater until follow-up. Do not remove any tape if under the dressings/gauze.  Medications: Resume all home medications. For mild to moderate pain: acetaminophen (Tylenol) or ibuprofen (if no kidney disease). Combining Tylenol with alcohol can substantially increase your risk of causing liver disease. Narcotic pain medications, if prescribed, can be used for severe pain, though may cause nausea, constipation, and drowsiness. Do not combine Tylenol and Percocet within a 6 hour period as Percocet contains Tylenol. If you do not need the narcotic pain medication, you do not need to fill the prescription.  Call office [phone]) at any time if any questions, worsening pain, fevers/chills, bleeding, drainage from incision site, or other concerns.

## 2016-12-31 NOTE — Anesthesia Postprocedure Evaluation (Signed)
Anesthesia Post Note  Patient: Joseph QuintonJustin Hines  Procedure(s) Performed: Procedure(s) (LRB): APPENDECTOMY LAPAROSCOPIC (N/A)  Patient location during evaluation: Nursing Unit Anesthesia Type: General Level of consciousness: awake and alert and oriented Pain management: pain level controlled Vital Signs Assessment: post-procedure vital signs reviewed and stable Respiratory status: spontaneous breathing Cardiovascular status: blood pressure returned to baseline Postop Assessment: no signs of nausea or vomiting Anesthetic complications: no     Last Vitals:  Vitals:   12/30/16 2052 12/31/16 0629  BP:  105/60  Pulse: 76 68  Resp: 16 14  Temp:  36.7 C    Last Pain:  Vitals:   12/31/16 0629  TempSrc: Oral  PainSc:                  Glynn OctaveANIEL,Vana Arif

## 2017-01-01 NOTE — Telephone Encounter (Signed)
Noted. Pt has upcoming ov.

## 2017-01-03 ENCOUNTER — Ambulatory Visit (INDEPENDENT_AMBULATORY_CARE_PROVIDER_SITE_OTHER): Payer: Self-pay | Admitting: Gastroenterology

## 2017-01-03 ENCOUNTER — Encounter: Payer: Self-pay | Admitting: Gastroenterology

## 2017-01-03 ENCOUNTER — Other Ambulatory Visit: Payer: Self-pay | Admitting: Gastroenterology

## 2017-01-03 ENCOUNTER — Other Ambulatory Visit: Payer: Self-pay

## 2017-01-03 VITALS — BP 132/81 | HR 124 | Temp 97.6°F | Ht 68.0 in | Wt 201.2 lb

## 2017-01-03 DIAGNOSIS — B182 Chronic viral hepatitis C: Secondary | ICD-10-CM

## 2017-01-03 MED ORDER — GLECAPREVIR-PIBRENTASVIR 100-40 MG PO TABS: 3.0000 | ORAL_TABLET | Freq: Every day | ORAL | 1 refills | Status: AC

## 2017-01-03 NOTE — Progress Notes (Signed)
Primary Care Physician: Jacquelin HawkingMcElroy, Shannon, PA-C  Primary Gastroenterologist:  Roetta SessionsMichael Rourk, MD   Chief Complaint  Patient presents with  . Hepatitis C    HPI: Joseph Hines is a 31 y.o. male hereTo start hep C treatment. 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 Plans for Mavyret, 3 tabs daily for 8 weeks.  Start date has been delayed awaiting patient assistance. Patient's last liver imaging via CT with contrast 12/30/2016 when he presented with acute appendicitis. Liver unremarkable. Prior F0/F1 fibrosis score via ultrasound. Recent CBC, PT/INR, CMET unremarkable.   Currently patient is doing well. Had uneventful appendectomy on August 5. No longer on pain medication. In fact does not take any medications. Mild abdominal wall soreness. Appetite is good. Bowel function is normal. No blood in the stool or melena. No heartburn or indigestion. History of substance abuse but remains in remission.   No current outpatient prescriptions on file.   No current facility-administered medications for this visit.     Allergies as of 01/03/2017  . (No Known Allergies)    ROS:  General: Negative for anorexia, weight loss, fever, chills, fatigue, weakness. ENT: Negative for hoarseness, difficulty swallowing , nasal congestion. CV: Negative for chest pain, angina, palpitations, dyspnea on exertion, peripheral edema.  Respiratory: Negative for dyspnea at rest, dyspnea on exertion, cough, sputum, wheezing.  GI: See history of present illness. GU:  Negative for dysuria, hematuria, urinary incontinence, urinary frequency, nocturnal urination.  Endo: Negative for unusual weight change.    Physical Examination:   BP 132/81   Pulse (!) 124   Temp 97.6 F (36.4 C) (Oral)   Ht 5\' 8"  (1.727 m)   Wt 201 lb 3.2 oz (91.3 kg)   BMI 30.59 kg/m   General: Well-nourished, well-developed in no acute distress.  Eyes: No icterus. Mouth: Oropharyngeal mucosa moist and  pink , no lesions erythema or exudate. Lungs: Clear to auscultation bilaterally.  Heart: Regular rate and rhythm, no murmurs rubs or gallops.  Abdomen: Bowel sounds are normal, nontender, nondistended, no hepatosplenomegaly or masses, no abdominal bruits or hernia , no rebound or guarding.   Extremities: No lower extremity edema. No clubbing or deformities. Neuro: Alert and oriented x 4   Skin: Warm and dry, no jaundice.   Psych: Alert and cooperative, normal mood and affect.  Labs:  Lab Results  Component Value Date   CREATININE 0.83 12/31/2016   BUN 10 12/31/2016   NA 139 12/31/2016   K 3.8 12/31/2016   CL 105 12/31/2016   CO2 28 12/31/2016   Lab Results  Component Value Date   ALT 45 12/30/2016   AST 37 12/30/2016   ALKPHOS 44 12/30/2016   BILITOT 0.6 12/30/2016   Lab Results  Component Value Date   WBC 6.5 12/31/2016   HGB 13.5 12/31/2016   HCT 40.5 12/31/2016   MCV 90.6 12/31/2016   PLT 141 (L) 12/31/2016   Lab Results  Component Value Date   INR 0.94 12/30/2016   INR 1.0 11/29/2016   INR 1.0 05/01/2016    Imaging Studies: Ct Abdomen Pelvis W Contrast  Result Date: 12/30/2016 CLINICAL DATA:  Acute onset right lower quadrant pain and nausea this morning. EXAM: CT ABDOMEN AND PELVIS WITH CONTRAST TECHNIQUE: Multidetector CT imaging of the abdomen and pelvis was performed using the standard protocol following bolus administration of intravenous contrast. CONTRAST:  100mL ISOVUE-300 IOPAMIDOL (ISOVUE-300) INJECTION 61% COMPARISON:  06/20/2016 and 07/23/2004 FINDINGS: Lower Chest: No acute  findings. Hepatobiliary: No hepatic masses identified. Gallbladder is unremarkable. Pancreas:  No mass or inflammatory changes. Spleen: Within normal limits in size and appearance. Adrenals/Urinary Tract: No masses identified. Tiny bilateral renal cysts. No evidence of hydronephrosis. Stomach/Bowel: The enlarged appendix seen measuring 9 mm with mild periappendiceal inflammatory changes.  This is consistent with acute appendicitis. No evidence of abscess, bowel obstruction, or other complication Vascular/Lymphatic: No pathologically enlarged lymph nodes. Splenic artery embolization coils noted. No abdominal aortic aneurysm. Reproductive:  No mass or other significant abnormality. Other:  None. Musculoskeletal: No suspicious bone lesions identified. Right femoral intramedullary rod. IMPRESSION: Positive for acute appendicitis. No evidence of abscess or other complication. Electronically Signed   By: Myles Rosenthal M.D.   On: 12/30/2016 09:44

## 2017-01-03 NOTE — Progress Notes (Signed)
cc'ed to pcp °

## 2017-01-03 NOTE — Patient Instructions (Signed)
1. Start Mavyret on August 20th 2018. 2. Take ALL three tablets with food at breakfast each day. Take at the same time each day. Do not miss a dose.  3. DO NOT START ANY NEW MEDICATIONS WITHOUT CONSULTING WITH US. This includes prescription or over the counter medications.  4. You will take a total of 8 weeks of Mavyret. You need to take ALL the medication to be cured of Hepatitis C. 5. You will come back in to see me after completing four weeks of Mavyret.  6. Have your labs done after 4 weeks of Mavyret.  7. Be sure to come and get your second box of Mavyret before you run out of medication. It should be delivered at any time.  8. CALL WITH ANY QUESTIONS!

## 2017-01-03 NOTE — Assessment & Plan Note (Signed)
Mavyret, 3 tabs every morning for 8 weeks. Discussed need for compliance. Discussed need to continue to avoid illicit drugs and alcohol. If need for any additional medications whether over-the-counter or prescription he will let us know first to make sure they don't interfere with his hepatitis C medication. He will come back after he has been on his medication for 4 weeks. We will delay his start date to August 20 because of recent appendectomy. Obtain labs 4 weeks into therapy including CBC, CMET, HCV RNA quantitative.

## 2017-01-04 NOTE — Progress Notes (Signed)
Patient aware. Discussed at ov.

## 2017-01-07 NOTE — Progress Notes (Signed)
Please call patient and make sure he is aware that Mavyret needs to be taken WITH food.  I would like to also send him a copy of patient information on Mavyret which I have in my office.

## 2017-01-07 NOTE — Progress Notes (Signed)
Pt is aware. I have mailed the information sheet to him.

## 2017-01-11 NOTE — Discharge Summary (Signed)
Physician Discharge Summary  Patient ID: Joseph Hines MRN: [id number] DOB/AGE: 01-02-1986 31 y.o.  Admit date: 12/30/2016 Discharge date: 01/11/2017  Admission Diagnoses:  Discharge Diagnoses:  Active Problems:   Acute appendicitis   Discharged Condition: good  Hospital Course: 31 year old Male presented to [facility] with acute onset of periumbilical pain that progressed over < 24 hours to become most focused at his RLQ. Workup included CT imaging significant for acute non-perforated appendicitis. Informed consent was obtained, and patient underwent uneventful laparoscopic appendectomy (Tsuei, 12/30/2016). The remainder of patient's post-operative course and hospital admission were unremarkable with patient's pain being well-controlled and tolerating diet and ambulation. Discharge planning was accordingly initiated, and patient was safely able to be discharged home with appropriate pain control, discharge instructions, and follow-up.  Consults: None  Significant Diagnostic Studies: radiology: CT scan: acute appendicitis  Treatments: IV hydration, antibiotics: Zosyn and surgery: laparoscopic appendectomy  Discharge Exam: Blood pressure 105/60, pulse 68, temperature 98 F (36.7 C), temperature source Oral, resp. rate 14, height 5\' 10"  (1.778 m), weight 214 lb (97.1 kg), SpO2 96 %. General appearance: alert, cooperative and no distress GI: soft and non-distended with mild peri-incisional tenderness to palpation, dressings c/d/i  Disposition: 01-Home or Self Care   Allergies as of 12/31/2016   No Known Allergies     Medication List    You have not been prescribed any medications.    Follow-up Information    Franky Macho, MD Follow up in 2 week(s).   Specialty:  General Surgery Why:  Hospital follow-up on 01/15/2017/ at 10:45 a.m. Contact information: [address] [place] [address] [phone]           Signed: Ancil Linsey 01/11/2017, 6:33  PM

## 2017-01-15 ENCOUNTER — Ambulatory Visit: Payer: Self-pay | Admitting: General Surgery

## 2017-02-12 ENCOUNTER — Encounter: Payer: Self-pay | Admitting: Gastroenterology

## 2017-02-12 ENCOUNTER — Ambulatory Visit (INDEPENDENT_AMBULATORY_CARE_PROVIDER_SITE_OTHER): Payer: Self-pay | Admitting: Gastroenterology

## 2017-02-12 VITALS — BP 131/76 | HR 98 | Temp 98.5°F | Ht 68.0 in | Wt 201.8 lb

## 2017-02-12 DIAGNOSIS — B182 Chronic viral hepatitis C: Secondary | ICD-10-CM

## 2017-02-12 NOTE — Progress Notes (Signed)
cc'ed to pcp °

## 2017-02-12 NOTE — Progress Notes (Signed)
      Primary Care Physician: Jacquelin Hawking, PA-C  Primary Gastroenterologist:  Roetta Sessions, MD   Chief Complaint  Patient presents with  . Hepatitis C    HPI: Joseph Hines is a 31 y.o. male here for follow-up of hepatitis C. History of treatment nave hepatitis C, genotype 2b without cirrhosis. On Mavyret 3 tabs daily for 8 weeks. Has completed 4 weeks of therapy. HCV RNA pending. Clinically doing well. No complaints. Has been alcohol and drug free since early 2017 with no urges or concerns for relapse. 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  Current Outpatient Prescriptions  Medication Sig Dispense Refill  . Glecaprevir-Pibrentasvir (MAVYRET) 100-40 MG TABS Take 3 tablets by mouth daily with breakfast. 90 tablet 1   No current facility-administered medications for this visit.     Allergies as of 02/12/2017  . (No Known Allergies)    ROS:  General: Negative for anorexia, weight loss, fever, chills, fatigue, weakness. ENT: Negative for hoarseness, difficulty swallowing , nasal congestion. CV: Negative for chest pain, angina, palpitations, dyspnea on exertion, peripheral edema.  Respiratory: Negative for dyspnea at rest, dyspnea on exertion, cough, sputum, wheezing.  GI: See history of present illness. GU:  Negative for dysuria, hematuria, urinary incontinence, urinary frequency, nocturnal urination.  Endo: Negative for unusual weight change.    Physical Examination:   BP 131/76   Pulse 98   Temp 98.5 F (36.9 C) (Oral)   Ht  (1.727 m)   Wt 201 lb 12.8 oz (91.5 kg)   BMI 30.68 kg/m   General: Well-nourished, well-developed in no acute distress.  Eyes: No icterus. Mouth: Oropharyngeal mucosa moist and pink , no lesions erythema or exudate. Lungs: Clear to auscultation bilaterally.  Heart: Regular rate and rhythm, no murmurs rubs or gallops.  Abdomen: Bowel sounds are normal, nontender, nondistended, no hepatosplenomegaly or masses, no abdominal bruits  or hernia , no rebound or guarding.   Extremities: No lower extremity edema. No clubbing or deformities. Neuro: Alert and oriented x 4   Skin: Warm and dry, no jaundice.   Psych: Alert and cooperative, normal mood and affect.  Labs:  Lab Results  Component Value Date   CREATININE 1.05 02/11/2017   BUN 20 02/11/2017   NA 138 02/11/2017   K 4.0 02/11/2017   CL 101 02/11/2017   CO2 31 02/11/2017   Lab Results  Component Value Date   ALT 20 02/11/2017   AST 22 02/11/2017   ALKPHOS 44 12/30/2016   BILITOT 0.3 02/11/2017   Lab Results  Component Value Date   WBC 7.4 02/11/2017   HGB 14.1 02/11/2017   HCT 41.6 02/11/2017   MCV 86.8 02/11/2017   PLT 203 02/11/2017     Imaging Studies: No results found.

## 2017-02-12 NOTE — Patient Instructions (Signed)
1. Complete 8 weeks of Mavyret. 2. We will be in touch once we receive your lab results.

## 2017-02-12 NOTE — Assessment & Plan Note (Signed)
Has completed 4 weeks of therapy without side effects. HCV RNA pending. Other labs look good. Denies any relapse in alcohol and drug use since early 2017. Patient aware he will need to complete a total weeks of Mavyret. Further recommendations regarding future labs after HCV RNA results are available.

## 2017-02-13 ENCOUNTER — Other Ambulatory Visit: Payer: Self-pay | Admitting: Gastroenterology

## 2017-02-13 DIAGNOSIS — B182 Chronic viral hepatitis C: Secondary | ICD-10-CM

## 2017-02-13 LAB — CBC WITH DIFFERENTIAL/PLATELET
Basophils Absolute: 7 cells/uL (ref 0–200)
Basophils Relative: 0.1 %
Eosinophils Absolute: 148 cells/uL (ref 15–500)
Eosinophils Relative: 2 %
HCT: 41.6 % (ref 38.5–50.0)
HEMOGLOBIN: 14.1 g/dL (ref 13.2–17.1)
Lymphs Abs: 1991 cells/uL (ref 850–3900)
MCH: 29.4 pg (ref 27.0–33.0)
MCHC: 33.9 g/dL (ref 32.0–36.0)
MCV: 86.8 fL (ref 80.0–100.0)
MONOS PCT: 7.4 %
MPV: 9.9 fL (ref 7.5–12.5)
NEUTROS ABS: 4706 {cells}/uL (ref 1500–7800)
Neutrophils Relative %: 63.6 %
Platelets: 203 10*3/uL (ref 140–400)
RBC: 4.79 10*6/uL (ref 4.20–5.80)
RDW: 12.9 % (ref 11.0–15.0)
Total Lymphocyte: 26.9 %
WBC: 7.4 10*3/uL (ref 3.8–10.8)
WBCMIX: 548 {cells}/uL (ref 200–950)

## 2017-02-13 LAB — HEPATITIS C RNA QUANTITATIVE
HCV Quantitative Log: <1.18 Log IU/mL
HCV RNA, PCR, QN: NOT DETECTED [IU]/mL

## 2017-02-13 LAB — COMPLETE METABOLIC PANEL WITH GFR
AG Ratio: 2.6 (calc) — ABNORMAL HIGH (ref 1.0–2.5)
ALBUMIN MSPROF: 4.5 g/dL (ref 3.6–5.1)
ALT: 20 U/L (ref 9–46)
AST: 22 U/L (ref 10–40)
Alkaline phosphatase (APISO): 49 U/L (ref 40–115)
BILIRUBIN TOTAL: 0.3 mg/dL (ref 0.2–1.2)
BUN: 20 mg/dL (ref 7–25)
CHLORIDE: 101 mmol/L (ref 98–110)
CO2: 31 mmol/L (ref 20–32)
Calcium: 8.8 mg/dL (ref 8.6–10.3)
Creat: 1.05 mg/dL (ref 0.60–1.35)
GFR, EST AFRICAN AMERICAN: 108 mL/min/{1.73_m2} (ref >=60)
GFR, EST NON AFRICAN AMERICAN: 93 mL/min/{1.73_m2} (ref >=60)
GLUCOSE: 141 mg/dL — AB (ref 65–139)
Globulin: 1.7 g/dL (calc) — ABNORMAL LOW (ref 1.9–3.7)
Potassium: 4 mmol/L (ref 3.5–5.3)
Sodium: 138 mmol/L (ref 135–146)
TOTAL PROTEIN: 6.2 g/dL (ref 6.1–8.1)

## 2017-02-13 NOTE — Progress Notes (Signed)
Please let patient know his HCV is NEGATIVE! Complete 8 full weeks of Mavyret!  12 weeks AFTER he completes Mavyret (05/2017)he will need CMET, HCV RNA quantitative testing and an OV.

## 2017-02-14 ENCOUNTER — Encounter: Payer: Self-pay | Admitting: Gastroenterology

## 2017-02-14 NOTE — Progress Notes (Signed)
APPOINTMENT MADE °

## 2017-02-26 ENCOUNTER — Ambulatory Visit: Payer: Self-pay | Admitting: Gastroenterology

## 2017-03-20 ENCOUNTER — Ambulatory Visit: Payer: Self-pay | Admitting: Physician Assistant

## 2017-03-25 ENCOUNTER — Encounter: Payer: Self-pay | Admitting: Physician Assistant

## 2017-05-02 ENCOUNTER — Other Ambulatory Visit: Payer: Self-pay

## 2017-05-02 DIAGNOSIS — B182 Chronic viral hepatitis C: Secondary | ICD-10-CM

## 2017-05-29 ENCOUNTER — Encounter: Payer: Self-pay | Admitting: Gastroenterology

## 2017-05-29 ENCOUNTER — Telehealth: Payer: Self-pay | Admitting: Internal Medicine

## 2017-05-29 ENCOUNTER — Ambulatory Visit: Payer: Self-pay | Admitting: Gastroenterology

## 2017-05-29 NOTE — Telephone Encounter (Signed)
PATIENT WAS A NO SHOW AND LETTER SENT  °

## 2018-03-22 IMAGING — CT CT ABD-PELV W/ CM
2 of 4 series · 17 of 46 positions shown, 19 images · IV contrast (Isovue)
Comparison: 06/20/2016 and 07/23/2004

CLINICAL DATA: Acute onset right lower quadrant pain and nausea
this morning.

EXAM:
CT ABDOMEN AND PELVIS WITH CONTRAST
TECHNIQUE: Multidetector CT imaging of the abdomen and pelvis was performed
using the standard protocol following bolus administration of
intravenous contrast.
CONTRAST:  100mL SNUHMZ-F44 IOPAMIDOL (SNUHMZ-F44) INJECTION 61%

[Series 2: axial st · axial · 0.76mm/px · z∈[+642,+1067]mm · 14 of 93 slices shown, 16 images]
[im 4/93  soft-tissue]
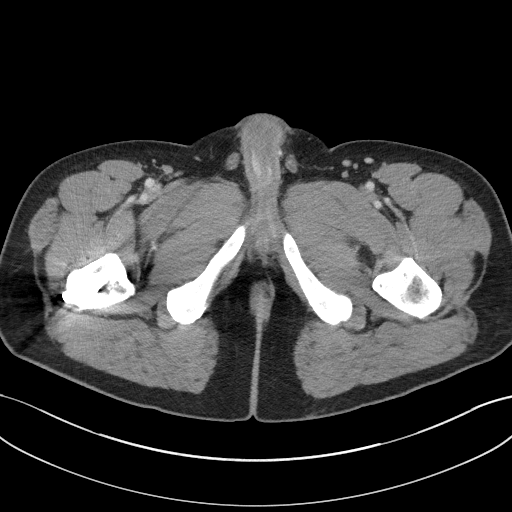
[im 4/93  bone]
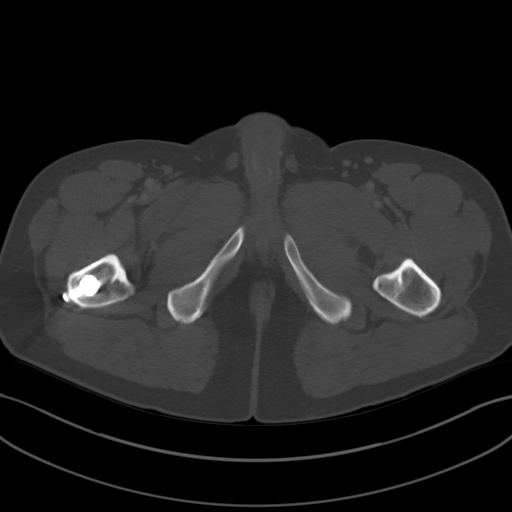
[im 11/93  soft-tissue]
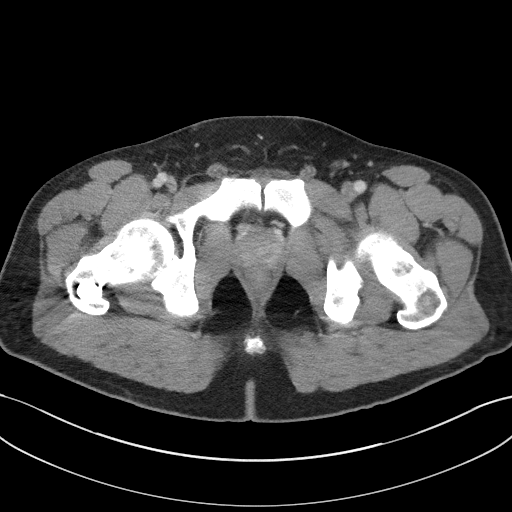
[im 18/93  soft-tissue]
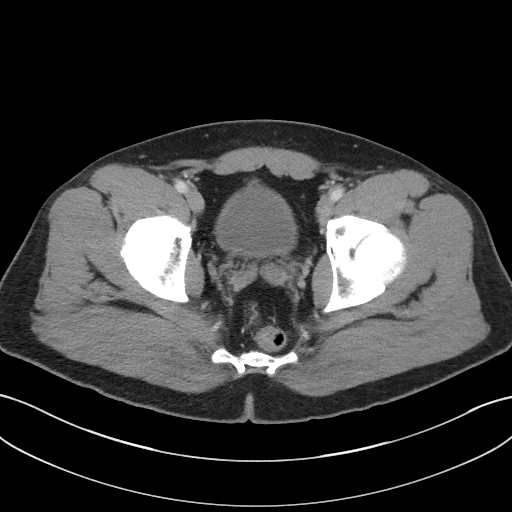
[im 25/93  soft-tissue]
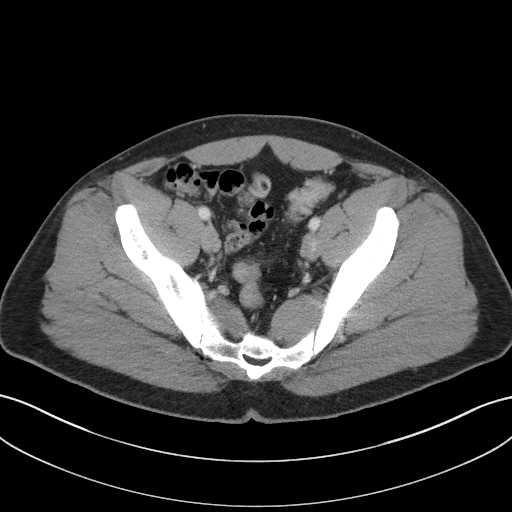
[im 32/93  soft-tissue]
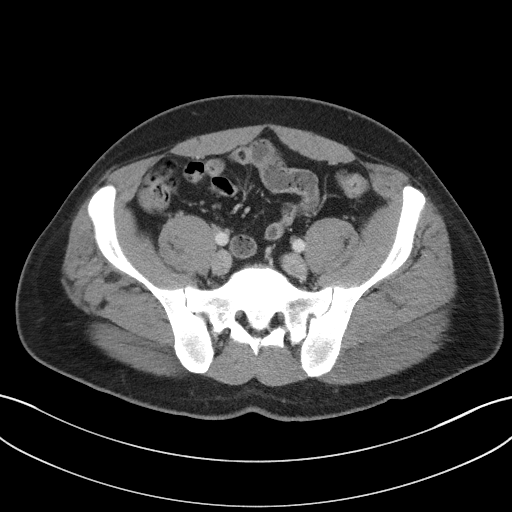
[im 36/93  soft-tissue]
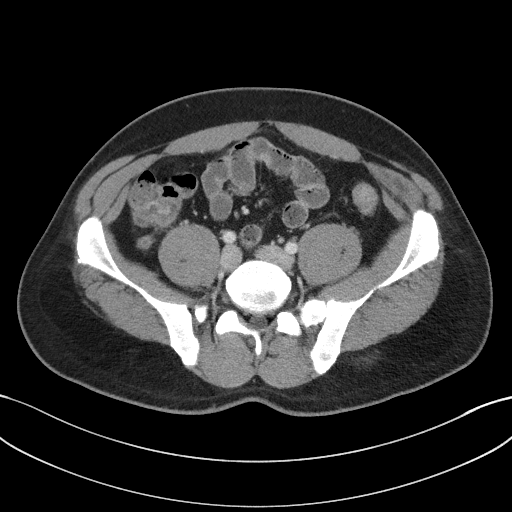
[im 43/93  soft-tissue]
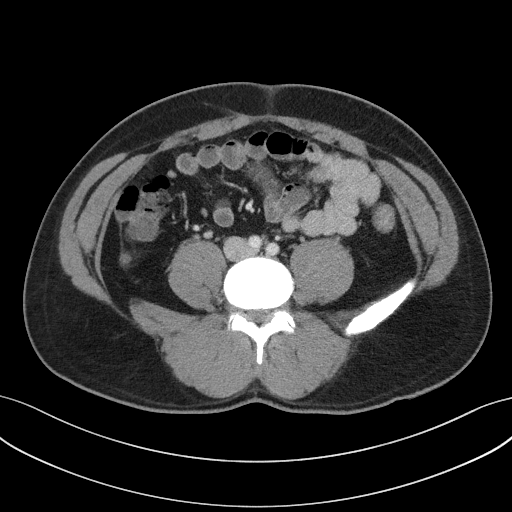
[im 50/93  soft-tissue]
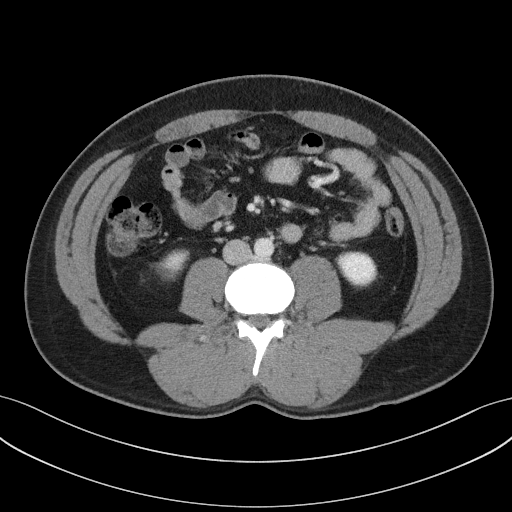
[im 57/93  soft-tissue]
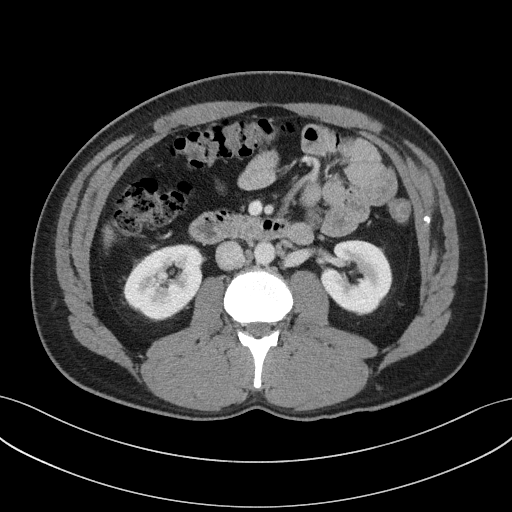
[im 57/93  bone]
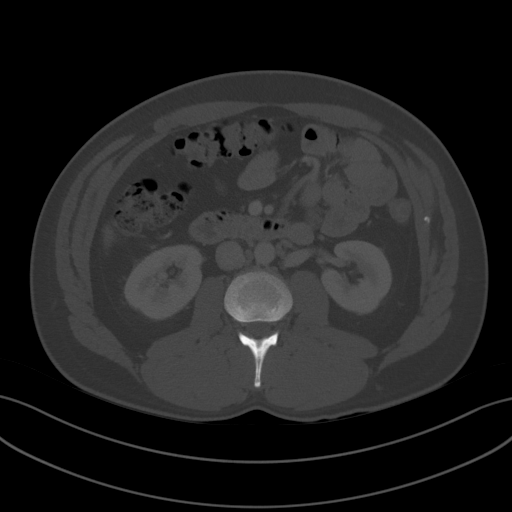
[im 61/93  soft-tissue]
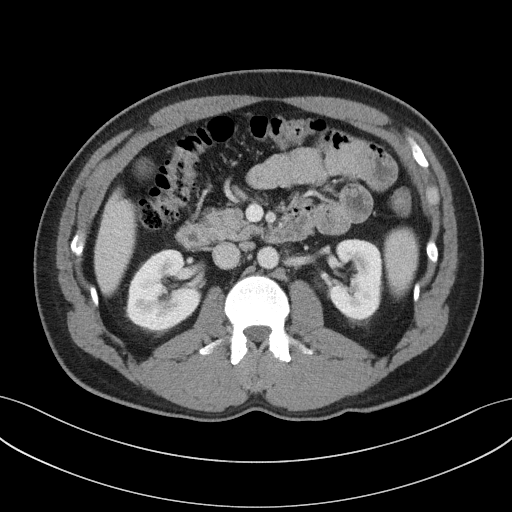
[im 68/93  soft-tissue]
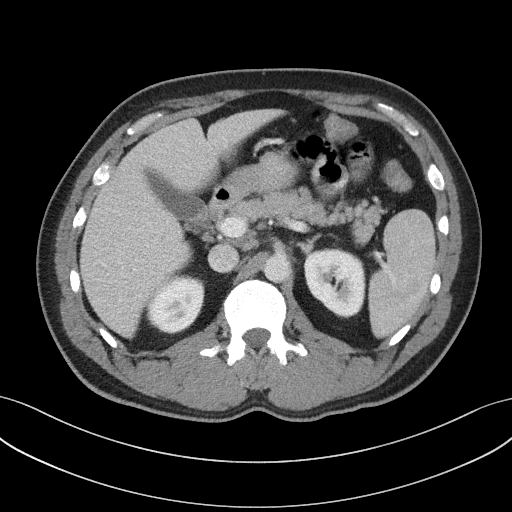
[im 75/93  soft-tissue]
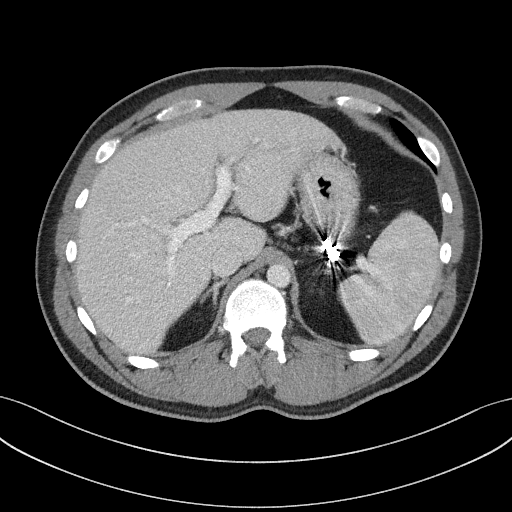
[im 82/93  soft-tissue]
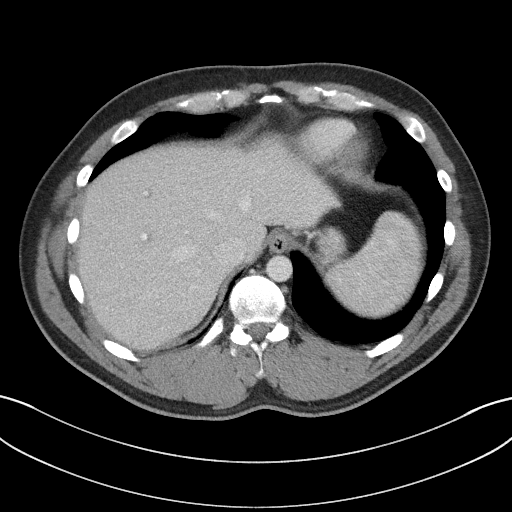
[im 89/93  soft-tissue]
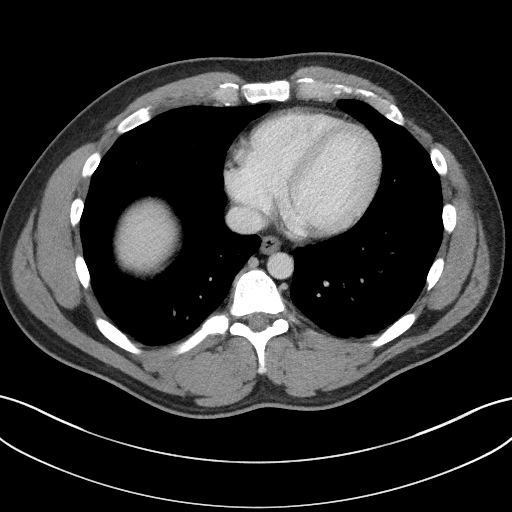

[Series 5: coronal st · coronal · 0.81mm/px · 3 of 101 slices shown]
[im 34/101  soft-tissue]
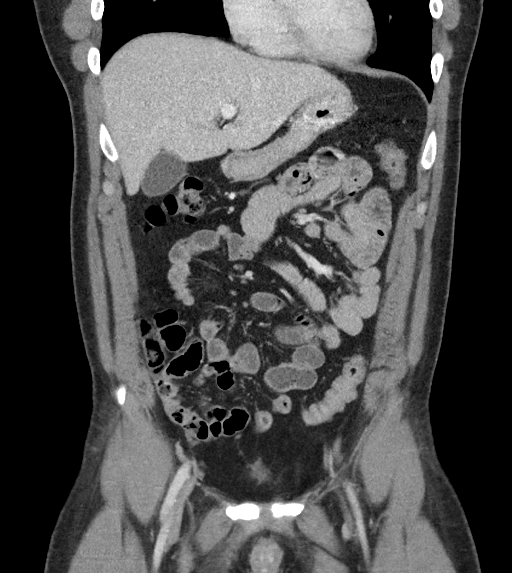
[im 45/101  soft-tissue]
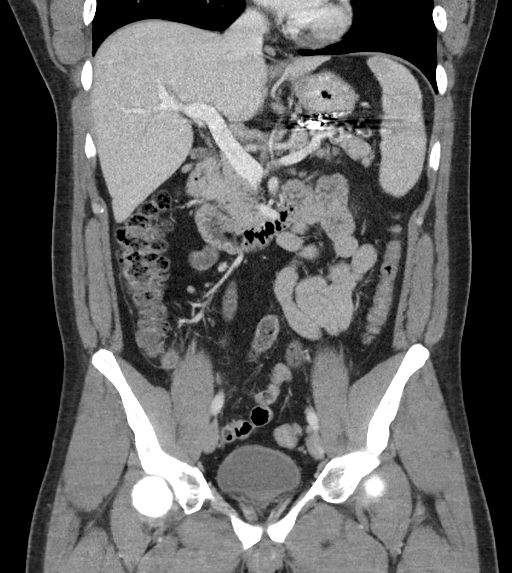
[im 56/101  soft-tissue]
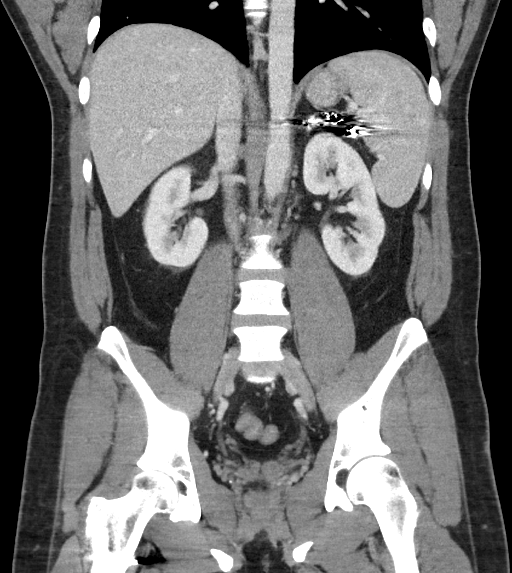

[17 of 46 positions shown; findings below may reference images not displayed]

FINDINGS: Lower Chest: No acute findings.

Hepatobiliary: No hepatic masses identified. Gallbladder is
unremarkable.

Pancreas:  No mass or inflammatory changes.

Spleen: Within normal limits in size and appearance.

Adrenals/Urinary Tract: No masses identified. Tiny bilateral renal
cysts. No evidence of hydronephrosis.

Stomach/Bowel: The enlarged appendix seen measuring 9 mm with mild
periappendiceal inflammatory changes. This is consistent with acute
appendicitis. No evidence of abscess, bowel obstruction, or other
complication

Vascular/Lymphatic: No pathologically enlarged lymph nodes. Splenic
artery embolization coils noted. No abdominal aortic aneurysm.

Reproductive:  No mass or other significant abnormality.

Other:  None.

Musculoskeletal: No suspicious bone lesions identified. Right
femoral intramedullary rod.
IMPRESSION: Positive for acute appendicitis. No evidence of abscess or other
complication.
